# Patient Record
Sex: Female | Born: 1965 | Race: White | Hispanic: No | Marital: Married | Smoking: Former smoker
Health system: Southern US, Community
[De-identification: ages and names within clinical notes are randomized; demographics above are authoritative.]

## PROBLEM LIST (undated history)

## (undated) DIAGNOSIS — E119 Type 2 diabetes mellitus without complications: Secondary | ICD-10-CM

## (undated) DIAGNOSIS — G5603 Carpal tunnel syndrome, bilateral upper limbs: Secondary | ICD-10-CM

## (undated) DIAGNOSIS — Z7901 Long term (current) use of anticoagulants: Secondary | ICD-10-CM

## (undated) DIAGNOSIS — I6930 Unspecified sequelae of cerebral infarction: Secondary | ICD-10-CM

## (undated) DIAGNOSIS — B009 Herpesviral infection, unspecified: Secondary | ICD-10-CM

## (undated) DIAGNOSIS — E039 Hypothyroidism, unspecified: Secondary | ICD-10-CM

## (undated) DIAGNOSIS — F329 Major depressive disorder, single episode, unspecified: Secondary | ICD-10-CM

## (undated) DIAGNOSIS — I10 Essential (primary) hypertension: Secondary | ICD-10-CM

## (undated) DIAGNOSIS — F32A Depression, unspecified: Secondary | ICD-10-CM

## (undated) DIAGNOSIS — E041 Nontoxic single thyroid nodule: Secondary | ICD-10-CM

## (undated) HISTORY — DX: Major depressive disorder, single episode, unspecified: F32.9

## (undated) HISTORY — DX: Depression, unspecified: F32.A

---

## 2005-12-22 ENCOUNTER — Ambulatory Visit (HOSPITAL_COMMUNITY): Admission: RE | Admit: 2005-12-22 | Discharge: 2005-12-22 | Payer: Self-pay | Admitting: Orthopedic Surgery

## 2011-03-31 ENCOUNTER — Emergency Department (HOSPITAL_COMMUNITY)
Admission: EM | Admit: 2011-03-31 | Discharge: 2011-03-31 | Disposition: A | Discharge status: Home / Self Care | Payer: Self-pay | Attending: Emergency Medicine | Admitting: Emergency Medicine

## 2011-03-31 DIAGNOSIS — N760 Acute vaginitis: Secondary | ICD-10-CM | POA: Insufficient documentation

## 2011-03-31 DIAGNOSIS — N342 Other urethritis: Secondary | ICD-10-CM | POA: Insufficient documentation

## 2011-03-31 DIAGNOSIS — A499 Bacterial infection, unspecified: Secondary | ICD-10-CM | POA: Insufficient documentation

## 2011-03-31 DIAGNOSIS — B9689 Other specified bacterial agents as the cause of diseases classified elsewhere: Secondary | ICD-10-CM | POA: Insufficient documentation

## 2011-03-31 DIAGNOSIS — R21 Rash and other nonspecific skin eruption: Secondary | ICD-10-CM | POA: Insufficient documentation

## 2011-03-31 LAB — URINALYSIS, ROUTINE W REFLEX MICROSCOPIC
Glucose, UA: 500 mg/dL — AB
Ketones, ur: 15 mg/dL — AB
Nitrite: NEGATIVE
Protein, ur: 100 mg/dL — AB
Specific Gravity, Urine: >1.03 — ABNORMAL HIGH (ref 1.005–1.030)
Urobilinogen, UA: 0.2 mg/dL (ref 0.0–1.0)
pH: 5 (ref 5.0–8.0)

## 2011-03-31 LAB — URINE MICROSCOPIC-ADD ON

## 2011-03-31 LAB — WET PREP, GENITAL
Clue Cells Wet Prep HPF POC: NONE SEEN
Trich, Wet Prep: NONE SEEN
Yeast Wet Prep HPF POC: NONE SEEN

## 2011-03-31 LAB — PREGNANCY, URINE: Preg Test, Ur: NEGATIVE

## 2011-04-03 ENCOUNTER — Emergency Department (HOSPITAL_COMMUNITY)
Admission: EM | Admit: 2011-04-03 | Discharge: 2011-04-03 | Disposition: A | Discharge status: Home / Self Care | Payer: Self-pay | Attending: Emergency Medicine | Admitting: Emergency Medicine

## 2011-04-03 DIAGNOSIS — L538 Other specified erythematous conditions: Secondary | ICD-10-CM | POA: Insufficient documentation

## 2014-05-15 ENCOUNTER — Emergency Department (HOSPITAL_COMMUNITY)
Admission: EM | Admit: 2014-05-15 | Discharge: 2014-05-15 | Disposition: A | Discharge status: Home / Self Care | Payer: BC Managed Care – PPO | Attending: Emergency Medicine | Admitting: Emergency Medicine

## 2014-05-15 ENCOUNTER — Encounter (HOSPITAL_COMMUNITY): Payer: Self-pay | Admitting: Emergency Medicine

## 2014-05-15 DIAGNOSIS — R21 Rash and other nonspecific skin eruption: Secondary | ICD-10-CM | POA: Insufficient documentation

## 2014-05-15 DIAGNOSIS — A6000 Herpesviral infection of urogenital system, unspecified: Secondary | ICD-10-CM | POA: Insufficient documentation

## 2014-05-15 HISTORY — DX: Herpesviral infection, unspecified: B00.9

## 2014-05-15 LAB — URINE MICROSCOPIC-ADD ON

## 2014-05-15 LAB — URINALYSIS, ROUTINE W REFLEX MICROSCOPIC
Bilirubin Urine: NEGATIVE
Glucose, UA: >1000 mg/dL — AB
Ketones, ur: 15 mg/dL — AB
Leukocytes, UA: NEGATIVE
Nitrite: NEGATIVE
Specific Gravity, Urine: 1.02 (ref 1.005–1.030)
Urobilinogen, UA: 0.2 mg/dL (ref 0.0–1.0)
pH: 7 (ref 5.0–8.0)

## 2014-05-15 MED ORDER — VALACYCLOVIR HCL 500 MG PO TABS: 500.0000 mg | ORAL_TABLET | Freq: Two times a day (BID) | ORAL | Status: DC

## 2014-05-15 MED ORDER — OXYCODONE-ACETAMINOPHEN 5-325 MG PO TABS: 1.0000 | ORAL_TABLET | ORAL | Status: DC | PRN

## 2014-05-15 NOTE — ED Notes (Signed)
Pt c/o vaginal rash that started 4-5 days ago, was seen at urgent care yesterday, states "we are in between a few things". Pt states that she feels like the rash is herpes, states "I had a flare up years ago and it feels the same"

## 2014-05-15 NOTE — ED Provider Notes (Signed)
CSN: [id number]     Arrival date & time 05/15/14  1133 History  This chart was scribed for Natalie Razor, MD by Leone Payor, ED Scribe. This patient was seen in room APA01/[facility]01 and the patient's care was started 3:34 PM.    Chief Complaint  Patient presents with  . Rash    The history is provided by the patient. No language interpreter was used.    HPI Comments: [name] is a 48 y.o. female who presents to the Emergency Department complaining of 4 days of a gradual onset, gradually worsening, constant vulvar rash and vulvar pain. She describes symptoms as burning and is aggravated by urination. She reports a history of similar symptoms 15 years ago when she was diagnosed with herpes. She denies fever.   Past Medical History  Diagnosis Date  . Herpes    History reviewed. No pertinent past surgical history. No family history on file. History  Substance Use Topics  . Smoking status: Never Smoker   . Smokeless tobacco: Not on file  . Alcohol Use: No   OB History   Grav Para Term Preterm Abortions TAB SAB Ect Mult Living                 Review of Systems  Constitutional: Negative for fever.  Genitourinary:       +Vulvar rash +Vulvar pain  All other systems reviewed and are negative.     Allergies  Review of patient's allergies indicates no known allergies.  Home Medications   Prior to Admission medications   Not on File   BP 231/124  Pulse 109  Temp(Src) 99.4 F (37.4 C) (Oral)  Resp 18  Ht 5\' 5"  (1.651 m)  Wt 301 lb (136.533 kg)  BMI 50.09 kg/m2  SpO2 99% Physical Exam  Nursing note and vitals reviewed. Constitutional: She is oriented to person, place, and time. She appears well-developed and well-nourished.  HENT:  Head: Normocephalic.  Eyes: EOM are normal.  Neck: Normal range of motion.  Cardiovascular: Normal rate, regular rhythm and normal heart sounds.   Pulmonary/Chest: Effort normal and breath sounds normal.  Abdominal: She exhibits no  distension.  Genitourinary:  Erythematous rash to vulva and introitus with superficial shallow ulcers   Musculoskeletal: Normal range of motion.  Neurological: She is alert and oriented to person, place, and time.  Psychiatric: She has a normal mood and affect.    ED Course  Procedures (including critical care time)  DIAGNOSTIC STUDIES: Oxygen Saturation is 99% on RA, normal by my interpretation.    COORDINATION OF CARE: 3:37 PM Discussed treatment plan with pt at bedside and pt agreed to plan.   Labs Review Labs Reviewed  URINALYSIS, ROUTINE W REFLEX MICROSCOPIC - Abnormal; Notable for the following:    Glucose, UA >1000 (*)    Hgb urine dipstick TRACE (*)    Ketones, ur 15 (*)    Protein, ur TRACE (*)    All other components within normal limits  URINE MICROSCOPIC-ADD ON - Abnormal; Notable for the following:    Squamous Epithelial / LPF MANY (*)    Bacteria, UA FEW (*)    All other components within normal limits    Imaging Review No results found.   EKG Interpretation None      MDM   Final diagnoses:  Herpes genitalis    48 year old female with perineal/vulvar rash consistent with genital herpes. She has a history the same. Will place on Valtrex. She needs to followup  with PCP with regards to her hypertension.  No acute complaints which I can directly attribute to this and I do not feel that it needs emergent reduction or further evaluation at this time.  I personally preformed the services scribed in my presence. The recorded information has been reviewed is accurate. Natalie RazorStephen Stormy Connon, MD.   Natalie RazorStephen Rochell Mabie, MD 05/21/14 [phone]

## 2014-05-15 NOTE — Discharge Instructions (Signed)
Genital Herpes °Genital herpes is a sexually transmitted disease. This means that it is a disease passed by having sex with an infected person. There is no cure for genital herpes. The time between attacks can be months to years. The virus may live in a person but produce no problems (symptoms). This infection can be passed to a baby as it travels down the birth canal (vagina). In a newborn, this can cause central nervous system damage, eye damage, or even death. The virus that causes genital herpes is usually HSV-2 virus. The virus that causes oral herpes is usually HSV-1. The diagnosis (learning what is wrong) is made through culture results. °SYMPTOMS  °Usually symptoms of pain and itching begin a few days to a week after contact. It first appears as small blisters that progress to small painful ulcers which then scab over and heal after several days. It affects the outer genitalia, birth canal, cervix, penis, anal area, buttocks, and thighs. °HOME CARE INSTRUCTIONS  °· Keep ulcerated areas dry and clean. °· Take medications as directed. Antiviral medications can speed up healing. They will not prevent recurrences or cure this infection. These medications can also be taken for suppression if there are frequent recurrences. °· While the infection is active, it is contagious. Avoid all sexual contact during active infections. °· Condoms may help prevent spread of the herpes virus. °· Practice safe sex. °· Wash your hands thoroughly after touching the genital area. °· Avoid touching your eyes after touching your genital area. °· Inform your caregiver if you have had genital herpes and become pregnant. It is your responsibility to insure a safe outcome for your baby in this pregnancy. °· Only take over-the-counter or prescription medicines for pain, discomfort, or fever as directed by your caregiver. °SEEK MEDICAL CARE IF:  °· You have a recurrence of this infection. °· You do not respond to medications and are not  improving. °· You have new sources of pain or discharge which have changed from the original infection. °· You have an oral temperature above 102° F (38.9° C). °· You develop abdominal pain. °· You develop eye pain or signs of eye infection. °Document Released: 10/02/2000 Document Revised: 12/28/2011 Document Reviewed: 10/23/2009 °Exit[employer]® Patient Information ©2015 [employer]. This information is not intended to replace advice given to you by your health care provider. Make sure you discuss any questions you have with your health care provider. ° °

## 2014-05-15 NOTE — ED Notes (Signed)
MD at bedside. 

## 2016-05-22 ENCOUNTER — Observation Stay (HOSPITAL_COMMUNITY): Payer: 59

## 2016-05-22 ENCOUNTER — Observation Stay (HOSPITAL_COMMUNITY)
Admission: EM | Admit: 2016-05-22 | Discharge: 2016-05-23 | Disposition: A | Discharge status: Home / Self Care | Payer: 59 | Attending: Internal Medicine | Admitting: Internal Medicine

## 2016-05-22 ENCOUNTER — Encounter (HOSPITAL_COMMUNITY): Payer: Self-pay

## 2016-05-22 ENCOUNTER — Emergency Department (HOSPITAL_COMMUNITY): Payer: 59

## 2016-05-22 DIAGNOSIS — I639 Cerebral infarction, unspecified: Secondary | ICD-10-CM | POA: Diagnosis not present

## 2016-05-22 DIAGNOSIS — I160 Hypertensive urgency: Secondary | ICD-10-CM | POA: Diagnosis not present

## 2016-05-22 DIAGNOSIS — Z79899 Other long term (current) drug therapy: Secondary | ICD-10-CM | POA: Diagnosis not present

## 2016-05-22 DIAGNOSIS — I10 Essential (primary) hypertension: Secondary | ICD-10-CM | POA: Diagnosis not present

## 2016-05-22 DIAGNOSIS — R109 Unspecified abdominal pain: Secondary | ICD-10-CM | POA: Insufficient documentation

## 2016-05-22 DIAGNOSIS — E669 Obesity, unspecified: Secondary | ICD-10-CM | POA: Diagnosis present

## 2016-05-22 DIAGNOSIS — E041 Nontoxic single thyroid nodule: Secondary | ICD-10-CM | POA: Diagnosis present

## 2016-05-22 DIAGNOSIS — R200 Anesthesia of skin: Secondary | ICD-10-CM | POA: Diagnosis present

## 2016-05-22 LAB — COMPREHENSIVE METABOLIC PANEL
ALK PHOS: 84 U/L (ref 38–126)
ALT: 57 U/L — ABNORMAL HIGH (ref 14–54)
ANION GAP: 6 (ref 5–15)
AST: 35 U/L (ref 15–41)
Albumin: 4.4 g/dL (ref 3.5–5.0)
BUN: 12 mg/dL (ref 6–20)
CALCIUM: 8.9 mg/dL (ref 8.9–10.3)
CHLORIDE: 103 mmol/L (ref 101–111)
CO2: 24 mmol/L (ref 22–32)
Creatinine, Ser: 0.51 mg/dL (ref 0.44–1.00)
GFR calc non Af Amer: >60 mL/min (ref >60)
Glucose, Bld: 252 mg/dL — ABNORMAL HIGH (ref 65–99)
Potassium: 3.6 mmol/L (ref 3.5–5.1)
SODIUM: 133 mmol/L — AB (ref 135–145)
Total Bilirubin: 0.6 mg/dL (ref 0.3–1.2)
Total Protein: 8.3 g/dL — ABNORMAL HIGH (ref 6.5–8.1)

## 2016-05-22 LAB — ANTITHROMBIN III: AntiThromb III Func: 111 % (ref 75–120)

## 2016-05-22 LAB — URINALYSIS, ROUTINE W REFLEX MICROSCOPIC
BILIRUBIN URINE: NEGATIVE
Glucose, UA: 500 mg/dL — AB
Hgb urine dipstick: NEGATIVE
KETONES UR: 15 mg/dL — AB
LEUKOCYTES UA: NEGATIVE
NITRITE: NEGATIVE
Protein, ur: 30 mg/dL — AB
SPECIFIC GRAVITY, URINE: 1.02 (ref 1.005–1.030)
pH: 6 (ref 5.0–8.0)

## 2016-05-22 LAB — RAPID URINE DRUG SCREEN, HOSP PERFORMED
AMPHETAMINES: NOT DETECTED
BENZODIAZEPINES: NOT DETECTED
Barbiturates: NOT DETECTED
COCAINE: NOT DETECTED
Opiates: NOT DETECTED
TETRAHYDROCANNABINOL: NOT DETECTED

## 2016-05-22 LAB — CBC
HEMATOCRIT: 47 % — AB (ref 36.0–46.0)
HEMOGLOBIN: 16.4 g/dL — AB (ref 12.0–15.0)
MCH: 30.7 pg (ref 26.0–34.0)
MCHC: 34.9 g/dL (ref 30.0–36.0)
MCV: 88 fL (ref 78.0–100.0)
Platelets: 214 10*3/uL (ref 150–400)
RBC: 5.34 MIL/uL — ABNORMAL HIGH (ref 3.87–5.11)
RDW: 14 % (ref 11.5–15.5)
WBC: 9.2 10*3/uL (ref 4.0–10.5)

## 2016-05-22 LAB — DIFFERENTIAL
BASOS ABS: 0 10*3/uL (ref 0.0–0.1)
BASOS PCT: 0 %
EOS ABS: 0.2 10*3/uL (ref 0.0–0.7)
EOS PCT: 2 %
Lymphocytes Relative: 22 %
Lymphs Abs: 2.1 10*3/uL (ref 0.7–4.0)
MONO ABS: 0.9 10*3/uL (ref 0.1–1.0)
Monocytes Relative: 10 %
Neutro Abs: 6.1 10*3/uL (ref 1.7–7.7)
Neutrophils Relative %: 66 %

## 2016-05-22 LAB — TROPONIN I: Troponin I: <0.03 ng/mL (ref <0.03)

## 2016-05-22 LAB — PROTIME-INR
INR: 0.93
Prothrombin Time: 12.5 seconds (ref 11.4–15.2)

## 2016-05-22 LAB — URINE MICROSCOPIC-ADD ON
Bacteria, UA: NONE SEEN
RBC / HPF: NONE SEEN RBC/hpf (ref 0–5)
WBC UA: NONE SEEN WBC/hpf (ref 0–5)

## 2016-05-22 LAB — APTT: APTT: 27 s (ref 24–36)

## 2016-05-22 LAB — ETHANOL: Alcohol, Ethyl (B): <5 mg/dL (ref <5)

## 2016-05-22 MED ORDER — LORAZEPAM 2 MG/ML IJ SOLN
1.0000 mg | Freq: Once | INTRAMUSCULAR | Status: AC
  Administered 2016-05-22: 1 mg via INTRAVENOUS
  Filled 2016-05-22: qty 1

## 2016-05-22 MED ORDER — LABETALOL HCL 5 MG/ML IV SOLN
10.0000 mg | Freq: Once | INTRAVENOUS | Status: AC
  Administered 2016-05-22: 10 mg via INTRAVENOUS
  Filled 2016-05-22: qty 4

## 2016-05-22 MED ORDER — ASPIRIN 325 MG PO TABS
325.0000 mg | ORAL_TABLET | Freq: Every day | ORAL | Status: DC
  Administered 2016-05-22 – 2016-05-23 (×2): 325 mg via ORAL
  Filled 2016-05-22 (×2): qty 1

## 2016-05-22 MED ORDER — ENOXAPARIN SODIUM 40 MG/0.4ML ~~LOC~~ SOLN
40.0000 mg | SUBCUTANEOUS | Status: DC
  Administered 2016-05-22 – 2016-05-23 (×2): 40 mg via SUBCUTANEOUS
  Filled 2016-05-22 (×2): qty 0.4

## 2016-05-22 MED ORDER — CLOPIDOGREL BISULFATE 75 MG PO TABS
75.0000 mg | ORAL_TABLET | Freq: Every day | ORAL | Status: DC
  Administered 2016-05-22 – 2016-05-23 (×2): 75 mg via ORAL
  Filled 2016-05-22 (×2): qty 1

## 2016-05-22 MED ORDER — ATORVASTATIN CALCIUM 40 MG PO TABS
40.0000 mg | ORAL_TABLET | Freq: Every day | ORAL | Status: DC
  Administered 2016-05-22: 40 mg via ORAL
  Filled 2016-05-22: qty 1

## 2016-05-22 MED ORDER — VALACYCLOVIR HCL 500 MG PO TABS
500.0000 mg | ORAL_TABLET | Freq: Two times a day (BID) | ORAL | Status: DC
  Administered 2016-05-22 – 2016-05-23 (×3): 500 mg via ORAL
  Filled 2016-05-22 (×7): qty 1

## 2016-05-22 MED ORDER — SENNOSIDES-DOCUSATE SODIUM 8.6-50 MG PO TABS: 1.0000 | ORAL_TABLET | Freq: Every evening | ORAL | Status: DC | PRN

## 2016-05-22 MED ORDER — LABETALOL HCL 5 MG/ML IV SOLN
10.0000 mg | INTRAVENOUS | Status: DC | PRN
  Administered 2016-05-22 – 2016-05-23 (×2): 10 mg via INTRAVENOUS
  Filled 2016-05-22 (×2): qty 4

## 2016-05-22 MED ORDER — SODIUM CHLORIDE 0.9 % IV SOLN
INTRAVENOUS | Status: DC
  Administered 2016-05-22: 14:00:00 via INTRAVENOUS

## 2016-05-22 MED ORDER — STROKE: EARLY STAGES OF RECOVERY BOOK
Freq: Once | Status: AC
  Administered 2016-05-22: 11:00:00
  Filled 2016-05-22: qty 1

## 2016-05-22 NOTE — ED Notes (Signed)
Pt says numbness to her right side that started Tuesday off & on until numbness was always there since yesterday. Pt says blood pressure is always high but is not being treated at present.

## 2016-05-22 NOTE — ED Provider Notes (Addendum)
[facility]-EMER[facility]ENCY DEPT Provider Note   CSN: [id number] Arrival date & time: 05/22/16  4098  First Provider Contact:  First MD Initiated Contact with Patient 05/22/16 0542        History   Chief Complaint Chief Complaint  Patient presents with  . Numbness    HPI Natalie Snyder is a 50 y.o. female.  Patient presents to the emergency part for evaluation of right-sided numbness. Patient reports that symptoms began 2 days ago. For the first day the symptoms are intermittent, but yesterday she noticed that the numbness became persistent. Since yesterday she has had numbness of her right arm and right leg with slight right facial numbness. No speech disturbance. She has not noticed any weakness of the extremities. Patient found to be very hypertensive in triage. She does report that her blood pressure has always been high, but she is not currently treated for hypertension. There is no associated headache.      Past Medical History:  Diagnosis Date  . Herpes     There are no active problems to display for this patient.   History reviewed. No pertinent surgical history.  OB History    No data available       Home Medications    Prior to Admission medications   Medication Sig Start Date End Date Taking? Authorizing Provider  Multiple Vitamin (MULTIVITAMIN) capsule Take 1 capsule by mouth daily.   Yes Historical Provider, MD  oxyCODONE-acetaminophen (PERCOCET/ROXICET) 5-325 MG per tablet Take 1-2 tablets by mouth every 4 (four) hours as needed for severe pain. 05/15/14   Raeford Razor, MD  valACYclovir (VALTREX) 500 MG tablet Take 1 tablet (500 mg total) by mouth 2 (two) times daily. 05/15/14   Raeford Razor, MD    Family History No family history on file.  Social History Social History  Substance Use Topics  . Smoking status: Never Smoker  . Smokeless tobacco: Never Used  . Alcohol use No     Allergies   Review of patient's allergies indicates no known  allergies.   Review of Systems Review of Systems  Neurological: Positive for numbness (Right-sided extremities).  All other systems reviewed and are negative.    Physical Exam Updated Vital Signs BP (!) 178/109   Pulse 99   Temp 98.4 F (36.9 C) (Oral)   Resp 18   Ht  (1.651 m)   Wt 280 lb (127 kg)   SpO2 98%   BMI 46.59 kg/m   Physical Exam  Constitutional: She is oriented to person, place, and time. She appears well-developed and well-nourished. No distress.  HENT:  Head: Normocephalic and atraumatic.  Right Ear: Hearing normal.  Left Ear: Hearing normal.  Nose: Nose normal.  Mouth/Throat: Oropharynx is clear and moist and mucous membranes are normal.  Eyes: Conjunctivae and EOM are normal. Pupils are equal, round, and reactive to light.  Neck: Normal range of motion. Neck supple.  Cardiovascular: Regular rhythm, S1 normal and S2 normal.  Tachycardia present.  Exam reveals no gallop and no friction rub.   No murmur heard. Pulmonary/Chest: Effort normal and breath sounds normal. No respiratory distress. She exhibits no tenderness.  Abdominal: Soft. Normal appearance and bowel sounds are normal. There is no hepatosplenomegaly. There is no tenderness. There is no rebound, no guarding, no tenderness at McBurney's point and negative Murphy's sign. No hernia.  Musculoskeletal: Normal range of motion.  Neurological: She is alert and oriented to person, place, and time. She has normal strength. A sensory  deficit (Decreased sensation to light touch and pinprick right arm and right leg) is present. No cranial nerve deficit. Coordination normal. GCS eye subscore is 4. GCS verbal subscore is 5. GCS motor subscore is 6.  Extraocular muscle movement: normal No visual field cut Pupils: equal and reactive both direct and consensual response is normal No nystagmus present    Sensory function is decreased in right arm and leg to light touch, pinprick Proprioception intact  Grip  strength 5/5 symmetric in upper extremities No pronator drift Normal finger to nose bilaterally  Lower extremity strength 5/5 against gravity Normal heel to shin bilaterally     Skin: Skin is warm, dry and intact. No rash noted. No cyanosis.  Psychiatric: She has a normal mood and affect. Her speech is normal and behavior is normal. Thought content normal.  Nursing note and vitals reviewed.    ED Treatments / Results  Labs (all labs ordered are listed, but only abnormal results are displayed) Labs Reviewed  CBC - Abnormal; Notable for the following:       Result Value   RBC 5.34 (*)    Hemoglobin 16.4 (*)    HCT 47.0 (*)    All other components within normal limits  COMPREHENSIVE METABOLIC PANEL - Abnormal; Notable for the following:    Sodium 133 (*)    Glucose, Bld 252 (*)    Total Protein 8.3 (*)    ALT 57 (*)    All other components within normal limits  URINALYSIS, ROUTINE W REFLEX MICROSCOPIC (NOT AT [facility]) - Abnormal; Notable for the following:    Glucose, UA 500 (*)    Ketones, ur 15 (*)    Protein, ur 30 (*)    All other components within normal limits  URINE MICROSCOPIC-ADD ON - Abnormal; Notable for the following:    Squamous Epithelial / LPF 0-5 (*)    All other components within normal limits  ETHANOL  PROTIME-INR  APTT  DIFFERENTIAL  URINE RAPID DRUG SCREEN, HOSP PERFORMED  TROPONIN I    EKG  EKG Interpretation  Date/Time:  Friday May 22 2016 05:40:41 EDT Ventricular Rate:  108 PR Interval:    QRS Duration: 87 QT Interval:  337 QTC Calculation: 452 R Axis:   -20 Text Interpretation:  Sinus tachycardia Borderline left axis deviation Low voltage, precordial leads Anteroseptal infarct, old No previous tracing Confirmed by POLLINA  MD, CHRISTOPHER [phone]) on 05/22/2016 6:25:06 AM       Radiology Ct Head Wo Contrast  Result Date: 05/22/2016 CLINICAL DATA:  RIGHT extremity numbness for 3 days, weakness. History of hypertension. EXAM: CT HEAD  WITHOUT CONTRAST TECHNIQUE: Contiguous axial images were obtained from the base of the skull through the vertex without intravenous contrast. COMPARISON:  None. FINDINGS: INTRACRANIAL CONTENTS: The ventricles and sulci are normal. No intraparenchymal hemorrhage, mass effect nor midline shift. No acute large vascular territory infarcts. Patchy white matter changes, including LEFT periatrial white matter. Subcentimeter hypodensity LEFT lateral thalamus. Small RIGHT frontal convexity arachnoid cyst. No abnormal extra-axial fluid collections. Basal cisterns are patent. ORBITS: The included ocular globes and orbital contents are normal. SINUSES: The mastoid aircells and included paranasal sinuses are well-aerated. SKULL/SOFT TISSUES: No skull fracture. No significant soft tissue swelling. IMPRESSION: Age indeterminate LEFT lateral thalamic lacunar infarct, given RIGHT-sided weakness this could be acute. Mild white matter changes suggest chronic small vessel ischemic disease. Electronically Signed   By: Awilda Metro M.D.   On: 05/22/2016 06:35    Procedures Procedures (including  critical care time)  Medications Ordered in ED Medications  labetalol (NORMODYNE,TRANDATE) injection 10 mg (not administered)  labetalol (NORMODYNE,TRANDATE) injection 10 mg (10 mg Intravenous Given 05/22/16 0601)     Initial Impression / Assessment and Plan / ED Course  I have reviewed the triage vital signs and the nursing notes.  Pertinent labs & imaging results that were available during my care of the patient were reviewed by me and considered in my medical decision making (see chart for details).  Clinical Course   Patient presents to the emergency part for evaluation of right-sided numbness. Patient reports intermittent symptoms 2 days ago and then yesterday the numbness on the right side of her body became continuous. Last known normal was therefore more than 12 hours before presentation to the emergency department.  She was not a candidate for TPA. She does have decreased sensation to light touch and pinprick on her right upper and right lower extremity but there is no obvious weakness associated with the sensory symptoms. Patient was found to be markedly hypertensive on arrival, this was treated with labetalol. She does report a history of elevated blood pressures but has never been treated for hypertension. As blood pressure improved, sensory deficit has not resolved. Lab work was unremarkable. CT scan shows age indeterminate left thalamic stroke which might expand patient's current symptoms. Patient will require hospitalization for further stroke workup.  Final Clinical Impressions(s) / ED Diagnoses   Final diagnoses:  None  Hypertensive urgency CVA    New Prescriptions New Prescriptions   No medications on file     Gilda Crease, MD 05/22/16 [id number]    Gilda Crease, MD 05/22/16 8185    Gilda Crease, MD 05/22/16 [phone]

## 2016-05-22 NOTE — Progress Notes (Signed)
Patient off floor for procedure 

## 2016-05-22 NOTE — ED Notes (Signed)
Pt continues to have numbness to right extremity and right leg. Able to ambulate to BR independently . No difficulties with drinking water

## 2016-05-22 NOTE — Evaluation (Signed)
Speech Language Pathology Evaluation Patient Details Name: Natalie Snyder MRN: [id number] DOB: [birth year] Today's Date: 05/22/2016 Time: 8588-5027 SLP Time Calculation (min) (ACUTE ONLY): 14 min  Problem List:  Patient Active Problem List   Diagnosis Date Noted  . CVA (cerebral infarction) 05/22/2016  . HTN (hypertension) 05/22/2016  . Obesity (BMI 30-39.9) 05/22/2016  . Acute CVA (cerebrovascular accident) (HCC) 05/22/2016   Past Medical History:  Past Medical History:  Diagnosis Date  . Herpes    Past Surgical History: History reviewed. No pertinent surgical history. HPI:  Natalie Snyder is a 50 year old female  presents to the [facility] for evaluation of right-sided numbness. MRI of head confirms Acute infarction left lateral thalamus as well as old lacunar infarct in left basal ganglia.    Assessment / Plan / Recommendation Clinical Impression  Pts cognitive abilities appear within functional limits as noted following informal cognitive assessment. Pt alert and oriented x4 and denies acute cognitive changes since onset of CVA.   Speech and language abilities also appear within functional limits. No ST needs identified at this time.      SLP Assessment       Follow Up Recommendations  None    Frequency and Duration           SLP Evaluation Prior Functioning  Cognitive/Linguistic Baseline: Within functional limits  Lives With: Spouse Available Help at Discharge: Family;Available PRN/intermittently Education: 12th Vocation: Unemployed   Cognition  Overall Cognitive Status: Within Functional Limits for tasks assessed Arousal/Alertness: Awake/alert Orientation Level: Oriented X4    Comprehension  Auditory Comprehension Overall Auditory Comprehension: Appears within functional limits for tasks assessed Visual Recognition/Discrimination Discrimination: Within Function Limits Reading Comprehension Reading Status: Within funtional limits    Expression Expression Primary  Mode of Expression: Verbal Verbal Expression Overall Verbal Expression: Appears within functional limits for tasks assessed Written Expression Dominant Hand: Right Written Expression: Within Functional Limits   Oral / Motor  Oral Motor/Sensory Function Overall Oral Motor/Sensory Function: Within functional limits Motor Speech Overall Motor Speech: Appears within functional limits for tasks assessed   GO                   Marcene Duos MA, CCC-SLP Acute Care Speech Language Pathologist    Kennieth Rad 05/22/2016, 4:35 PM

## 2016-05-22 NOTE — Evaluation (Signed)
Physical Therapy Evaluation Patient Details Name: Natalie Snyder MRN: [id number] DOB: Dec 26, 1965 Today's Date: 05/22/2016   History of Present Illness  50 yo F admitted with R UE and R LE numbness that is intermittent and began 2 days ago.  Hypertensive urgency.  CT scan shows age indeterminate left thalamic stroke which might expand patient's current symptoms.  PMH: Herpes  Clinical Impression  Pt received sitting up in bed, and was agreeable to PT evaluation.  Pt's son, and husband also present.  Pt expressed that she is normally independent with ambulation, ADL's, and IADL's, and helps to care for her father.  During PT evaluation, pt was found to have mild ataxia in the R UE, and Moderate ataxia in the R LE.  Transfer and gait training not assessed due to HTN with pt's BP found to be 216/117 with HR at 100bpm while sitting on the EOB.  RN notified of this finding. Pt assisted back into bed.  RN reports that the pt has been getting herself up to the bathroom independently while in the hospital, therefore, she will likely need OPPT to address balance and incoordination issues with the R LE.     Follow Up Recommendations Outpatient PT    Equipment Recommendations  None recommended by PT    Recommendations for Other Services       Precautions / Restrictions Precautions Precautions: Fall Precaution Comments: Due to R sided incoordination.  Restrictions Weight Bearing Restrictions: No      Mobility  Bed Mobility Overal bed mobility: Modified Independent             General bed mobility comments: HOB completely raised, but pt able to sit on the EOB without assistance.    Transfers Overall transfer level:  (NT due to BP limitations)                  Ambulation/Gait Ambulation/Gait assistance:  (NT due to BP limitations. )              Stairs            Wheelchair Mobility    Modified Rankin (Stroke Patients Only)       Balance                                             Pertinent Vitals/Pain Pain Assessment: No/denies pain    Home Living Family/patient expects to be discharged to:: Private residence Living Arrangements: Spouse/significant other (son - Equities trader. )   Type of Home: Mobile home Home Access: Stairs to enter   Entrance Stairs-Number of Steps: 2 steps Home Layout: Two level (bedroom upstairs. ) Home Equipment: None      Prior Function Level of Independence: Independent         Comments: Pt looks after her dad - bills, preparing food.      Hand Dominance   Dominant Hand: Right    Extremity/Trunk Assessment   Upper Extremity Assessment: RUE deficits/detail RUE Deficits / Details: Strength is WFL, Mild ataxia with R finger to nose, rapid alternating movements is [facility]         Lower Extremity Assessment: RLE deficits/detail RLE Deficits / Details:  Strength is WFL, Incoordination noted with both R heel to shin test, and rapid alternating movements of the ankles        Communication   Communication: No difficulties  Cognition Arousal/Alertness: Awake/alert Behavior During Therapy: WFL for tasks assessed/performed Overall Cognitive Status: Within Functional Limits for tasks assessed                      General Comments      Exercises        Assessment/Plan    PT Assessment Patient needs continued PT services  PT Diagnosis Abnormality of gait   PT Problem List Decreased coordination;Decreased balance;Impaired sensation;Obesity;Decreased activity tolerance  PT Treatment Interventions Gait training;Balance training;Neuromuscular re-education;Patient/family education   PT Goals (Current goals can be found in the Care Plan section) Acute Rehab PT Goals Patient Stated Goal: To have better control of the R LE.  PT Goal Formulation: With patient Time For Goal Achievement: 05/29/16 Potential to Achieve Goals: Good    Frequency 7X/week   Barriers to discharge         Co-evaluation               End of Session   Activity Tolerance: Treatment limited secondary to medical complications (Comment) Patient left: in bed;with family/visitor present Nurse Communication: Other (comment) (BP)         Time: 1130-1146 PT Time Calculation (min) (ACUTE ONLY): 16 min   Charges:   PT Evaluation $PT Eval Low Complexity: 1 Procedure     PT G Codes:        Beth Tishara Pizano, PT, DPT X: [phone]

## 2016-05-22 NOTE — H&P (Signed)
[facility] History and Physical  Natalie Snyder:[id number] DOB: 02-Nov-1965    PCP:   No PCP Per Patient   Chief Complaint: right sided numbness but no weakness for 48 hours.   HPI: Natalie Snyder is an 50 y.o. 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  Evalaution in the ER with head CT showed hyperdense area concerning for intermediate CVA of the Left lateral thalamic CVA.  She has not been on ASA, and has not been on any medications, as she hardly goes to regular medical care.  Hospitalist was asked to admit her for acute CVA.  She was out of the TPA window.    Rewiew of Systems:  Constitutional: Negative for malaise, fever and chills. No significant weight loss or weight gain Eyes: Negative for eye pain, redness and discharge, diplopia, visual changes, or flashes of light. ENMT: Negative for ear pain, hoarseness, nasal congestion, sinus pressure and sore throat. No headaches; tinnitus, drooling, or problem swallowing. Cardiovascular: Negative for chest pain, palpitations, diaphoresis, dyspnea and peripheral edema. ; No orthopnea, PND Respiratory: Negative for cough, hemoptysis, wheezing and stridor. No pleuritic chestpain. Gastrointestinal: Negative for nausea, vomiting, diarrhea, constipation, abdominal pain, melena, blood in stool, hematemesis, jaundice and rectal bleeding.    Genitourinary: Negative for frequency, dysuria, incontinence,flank pain and hematuria; Musculoskeletal: Negative for back pain and neck pain. Negative for swelling and trauma.;  Skin: . Negative for pruritus, rash, abrasions, bruising and skin lesion.; ulcerations Neuro: Negative for headache, lightheadedness and neck stiffness. Negative for weakness, altered level of consciousness , altered mental status, extremity weakness, burning feet, involuntary movement, seizure and  syncope.  Psych: negative for anxiety, depression, insomnia, tearfulness, panic attacks, hallucinations, paranoia, suicidal or homicidal ideation    Past Medical History:  Diagnosis Date  . Herpes     History reviewed. No pertinent surgical history.  Medications:  HOME MEDS: Prior to Admission medications   Medication Sig Start Date End Date Taking? Authorizing Provider  Multiple Vitamin (MULTIVITAMIN) capsule Take 1 capsule by mouth daily.   Yes Historical Provider, MD  oxyCODONE-acetaminophen (PERCOCET/ROXICET) 5-325 MG per tablet Take 1-2 tablets by mouth every 4 (four) hours as needed for severe pain. 05/15/14   Raeford Razor, MD  valACYclovir (VALTREX) 500 MG tablet Take 1 tablet (500 mg total) by mouth 2 (two) times daily. 05/15/14   Raeford Razor, MD     Allergies:  No Known Allergies  Social History:   reports that she has never smoked. She has never used smokeless tobacco. She reports that she does not drink alcohol or use drugs.  Family History: History reviewed. No pertinent family history.   Physical Exam: Vitals:   05/22/16 0715 05/22/16 0730 05/22/16 0745 05/22/16 0800  BP: 161/88 (!) 190/112 (!) 174/111 (!) 169/103  Pulse: 96 102  100  Resp: 22 (!) 27 20   Temp:      TempSrc:      SpO2: 98% 99%  96%  Weight:      Height:       Blood pressure (!) 169/103, pulse 100, temperature 98.4 F (36.9 C), temperature source Oral, resp. rate 20, height 5\' 5"  (1.651 m), weight 127 kg (280 lb), SpO2 96 %.  GEN:  Pleasant  patient lying in the stretcher in no acute distress; cooperative with exam. PSYCH:  alert and oriented x4; does not appear anxious  or depressed; affect is appropriate. HEENT: Mucous membranes pink and anicteric; PERRLA; EOM intact; no cervical lymphadenopathy nor thyromegaly or carotid bruit; no JVD; There were no stridor. Neck is very supple. Breasts:: Not examined CHEST WALL: No tenderness CHEST: Normal respiration, clear to auscultation  bilaterally.  HEART: Regular rate and rhythm.  There are no murmur, rub, or gallops.   BACK: No kyphosis or scoliosis; no CVA tenderness ABDOMEN: soft and non-tender; no masses, no organomegaly, normal abdominal bowel sounds; no pannus; no intertriginous candida. There is no rebound and no distention. Rectal Exam: Not done EXTREMITIES: No bone or joint deformity; age-appropriate arthropathy of the hands and knees; no edema; no ulcerations.  There is no calf tenderness. Genitalia: not examined PULSES: 2+ and symmetric SKIN: Normal hydration no rash or ulceration CNS: Cranial nerves 2-12 grossly intact no focal lateralizing neurologic deficit.  Speech is fluent; uvula elevated with phonation, facial symmetry and tongue midline. DTR are normal bilaterally, cerebella exam is intact, barbinski is negative and strengths are equaled bilaterally.  No sensory loss.   Labs on Admission:  Basic Metabolic Panel:  Recent Labs Lab 05/22/16 0550  NA 133*  K 3.6  CL 103  CO2 24  GLUCOSE 252*  BUN 12  CREATININE 0.51  CALCIUM 8.9   Liver Function Tests:  Recent Labs Lab 05/22/16 0550  AST 35  ALT 57*  ALKPHOS 84  BILITOT 0.6  PROT 8.3*  ALBUMIN 4.4   CBC:  Recent Labs Lab 05/22/16 0550  WBC 9.2  NEUTROABS 6.1  HGB 16.4*  HCT 47.0*  MCV 88.0  PLT 214   Cardiac Enzymes:  Recent Labs Lab 05/22/16 0550  TROPONINI <0.03   Radiological Exams on Admission: Ct Head Wo Contrast  Result Date: 05/22/2016 CLINICAL DATA:  RIGHT extremity numbness for 3 days, weakness. History of hypertension. EXAM: CT HEAD WITHOUT CONTRAST TECHNIQUE: Contiguous axial images were obtained from the base of the skull through the vertex without intravenous contrast. COMPARISON:  None. FINDINGS: INTRACRANIAL CONTENTS: The ventricles and sulci are normal. No intraparenchymal hemorrhage, mass effect nor midline shift. No acute large vascular territory infarcts. Patchy white matter changes, including LEFT  periatrial white matter. Subcentimeter hypodensity LEFT lateral thalamus. Small RIGHT frontal convexity arachnoid cyst. No abnormal extra-axial fluid collections. Basal cisterns are patent. ORBITS: The included ocular globes and orbital contents are normal. SINUSES: The mastoid aircells and included paranasal sinuses are well-aerated. SKULL/SOFT TISSUES: No skull fracture. No significant soft tissue swelling. IMPRESSION: Age indeterminate LEFT lateral thalamic lacunar infarct, given RIGHT-sided weakness this could be acute. Mild white matter changes suggest chronic small vessel ischemic disease. Electronically Signed   By: Awilda Metro M.D.   On: 05/22/2016 06:35    EKG: Independently reviewed.  Assessment/Plan Present on Admission: . CVA (cerebral infarction) . HTN (hypertension) . Obesity (BMI 30-39.9) . Acute CVA (cerebrovascular accident) [facility])  PLAN:  Acute CVA:  I suspect she has an acute CVA.  Will admit her for MRI confirming an acute CVA.  She will be placed on ASA, and if MRI is positive, will place her on DUAT.  Will start her on statin, and check lipid profile.  WIll obtain ECHO, carotids US or MRA of the brain also.   She has severe HTN, and I suspect she has small vessel disease, but I will send hypercoagulable work up, given she is young.  HTN:  Will let permissive HTN.  Use Labetelol PRN for SBP higher than 190 or DBP greater than 105.  Obesity: Diet recommended.  She should also obtain polysomnogram given hx of loud snoring and HTN.    Other plans as per orders. Code Status: FULL Unk Lightning, MD. FACP [facility] Pager [phone] 7pm to 7am.  05/22/2016, 8:14 AM

## 2016-05-22 NOTE — Progress Notes (Signed)
Inpatient Diabetes Program Recommendations  AACE/ADA: New Consensus Statement on Inpatient Glycemic Control (2015)  Target Ranges:  Prepandial:   less than 140 mg/dL      Peak postprandial:   less than 180 mg/dL (1-2 hours)      Critically ill patients:  140 - 180 mg/dL   8/4 Lab Glucose:  791  Spoke with pt.  Pt denies hx of DM.HgbA1c ordered.  If DM is confirmed please order: Living Well with DM booklet, Nutrition consult, OP DM education and begin education videos on DM. Pt states that she does not have a PCP.   Will need to make sure there is someone with whom to follow up. Mellissa Kohut RD, CDE. M.Ed. Pager [phone] Inpatient Diabetes Coordinator

## 2016-05-22 NOTE — ED Triage Notes (Signed)
Pt reports intermittent numbness and tingling to her right side from neck down to her feet x 2 days, states is more consistent this am.  Pt denies pain.

## 2016-05-23 DIAGNOSIS — E669 Obesity, unspecified: Secondary | ICD-10-CM | POA: Diagnosis not present

## 2016-05-23 DIAGNOSIS — I639 Cerebral infarction, unspecified: Secondary | ICD-10-CM | POA: Diagnosis not present

## 2016-05-23 DIAGNOSIS — E041 Nontoxic single thyroid nodule: Secondary | ICD-10-CM | POA: Diagnosis present

## 2016-05-23 DIAGNOSIS — I10 Essential (primary) hypertension: Secondary | ICD-10-CM | POA: Diagnosis not present

## 2016-05-23 LAB — LIPID PANEL
CHOL/HDL RATIO: 6.1 ratio
CHOLESTEROL: 224 mg/dL — AB (ref 0–200)
HDL: 37 mg/dL — AB (ref >40)
LDL Cholesterol: 130 mg/dL — ABNORMAL HIGH (ref 0–99)
Triglycerides: 283 mg/dL — ABNORMAL HIGH (ref <150)
VLDL: 57 mg/dL — AB (ref 0–40)

## 2016-05-23 LAB — PROTEIN S, TOTAL: PROTEIN S AG TOTAL: 113 % (ref 60–150)

## 2016-05-23 LAB — PROTEIN C ACTIVITY: Protein C Activity: 196 % — ABNORMAL HIGH (ref 73–180)

## 2016-05-23 LAB — PROTEIN S ACTIVITY: PROTEIN S ACTIVITY: 112 % (ref 63–140)

## 2016-05-23 LAB — PROTEIN C, TOTAL: PROTEIN C, TOTAL: 132 % (ref 60–150)

## 2016-05-23 MED ORDER — CLOPIDOGREL BISULFATE 75 MG PO TABS: 75.0000 mg | ORAL_TABLET | Freq: Every day | ORAL | 2 refills | Status: AC

## 2016-05-23 MED ORDER — LISINOPRIL 20 MG PO TABS: 20.0000 mg | ORAL_TABLET | Freq: Every day | ORAL | 1 refills | Status: DC

## 2016-05-23 MED ORDER — ATORVASTATIN CALCIUM 40 MG PO TABS: 40.0000 mg | ORAL_TABLET | Freq: Every day | ORAL | 1 refills | Status: AC

## 2016-05-23 MED ORDER — LISINOPRIL 10 MG PO TABS: 20.0000 mg | ORAL_TABLET | Freq: Every day | ORAL | Status: DC

## 2016-05-23 NOTE — Progress Notes (Signed)
Patient discharged home with instructions,given on medications,and follow up visits,patient verbalized understanding. Prescriptions sent with patient. Stroke booklet also given to patient,mapping your way to recovery. Accompanied by staff to an awaiting vehicle.

## 2016-05-23 NOTE — Discharge Summary (Signed)
Physician Discharge Summary  Natalie Snyder BJY:[id number] DOB: Apr 07, 1966 DOA: 05/22/2016  PCP: No PCP Per Patient  Admit date: 05/22/2016 Discharge date: 05/23/2016  Time spent: 35 minutes  Recommendations for Outpatient Follow-up:  1. Referred to Dr Gerda Diss for follow up new PCP.   2. Follow up with Dr Gerilyn Pilgrim of neurology for future stroke prevention.    Discharge Diagnoses:  Principal Problem:   CVA (cerebral infarction) Active Problems:   HTN (hypertension)   Obesity (BMI 30-39.9)   Acute CVA (cerebrovascular accident) [facility])   Discharge Condition: Same.   Diet recommendation: heart healthy.   Filed Weights   05/22/16 0536 05/22/16 0940  Weight: 127 kg (280 lb) 131.7 kg (290 lb 4.8 oz)    History of present illness: Patient was admitted by me on May 22, 2016 for acute CVA.  As per my prior H and P:  " Natalie Snyder is an 50 y.o. 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  Evalaution in the ER with head CT showed hyperdense area concerning for intermediate CVA of the Left lateral thalamic CVA.  She has not been on ASA, and has not been on any medications, as she hardly goes to regular medical care.  Hospitalist was asked to admit her for acute CVA.  She was out of the TPA window.   Hospital Course: Patient was admitted into the hospital, and her severe HTN was controlled with IV Labetelol, allowing reasonable permissive HTN.  She did have a CVA confirmed by the MRI in the left thalamic.  There are non critical obstruction of the cerebral arteries.  See description below.   Her ECHO at the time of discharge was still pending.  Her carotid did not show any significant stenosis, but incidentally picked up a thyroid nodule.  She was told and acknowledge recommendation for follow up thyroid ultrasound.  For her CVA, BP should be better controlled, and she was  started on Lisinopril  per day at discharge.  She was warned about angioedema, which will require immediate medical attention, along with idiopathic coughs.  She will also be placed on DUAT with ASA along with Plavix.  She was started on Lipitor as her lipid profiled showed low HDL, elevated total and LDL cholesterol.  She should also take fish oil, 2-4 grams per day.  She will get polysomnogram as I have high clinical suspicion that she has OSA.  It was noted that she has depression with anxiety features vs GAD, and though I have not started her on any medication, suggested that Lexapro ( Cis-isomer of Citalopram) would be a good choice of SSRI for her.  She doesn't have any PCP, and I have recommended a few PCPs, including Dr Gerda Diss.  She should follow up with neurology for follow up on her CVA.  Thank you and Good Day.    Consultations:  None.   Discharge Exam: Vitals:   05/23/16 0440 05/23/16 0843  BP: (!) 152/89 (!) 166/89  Pulse: 85 94  Resp: 20   Temp: 98.6 F (37 C) 98.1 F (36.7 C)      Discharge Instructions   Discharge Instructions    Diet - low sodium heart healthy    Complete by:  As directed   Discharge instructions    Complete by:  As directed   Follow up with Dr Gerda Diss or Dr Karilyn Cota.  Follow up with neurology Dr Jerre Simon as scheduled.   Increase activity slowly    Complete by:  As directed     Current Discharge Medication List    START taking these medications   Details  atorvastatin (LIPITOR) 40 MG tablet Take 1 tablet (40 mg total) by mouth daily at 6 PM. Qty: 30 tablet, Refills: 1    clopidogrel (PLAVIX) 75 MG tablet Take 1 tablet (75 mg total) by mouth daily. Qty: 30 tablet, Refills: 2    Linsinopril 20mg  per day.   CONTINUE these medications which have NOT CHANGED   Details  aspirin 325 MG tablet Take 650 mg by mouth once.    Cholecalciferol (VITAMIN D PO) Take 1 tablet by mouth daily.    Omega-3 Fatty Acids (FISH OIL PO) Take 3 capsules by mouth  daily.      STOP taking these medications     Cyanocobalamin (VITAMIN B-12 PO)      Multiple Vitamin (MULTIVITAMIN) capsule      naproxen sodium (ANAPROX) 220 MG tablet      TURMERIC PO      valACYclovir (VALTREX) 500 MG tablet        Allergies  Allergen Reactions  . Latex Rash      The results of significant diagnostics from this hospitalization (including imaging, microbiology, ancillary and laboratory) are listed below for reference.    Significant Diagnostic Studies: Ct Head Wo Contrast  Result Date: 05/22/2016 CLINICAL DATA:  RIGHT extremity numbness for 3 days, weakness. History of hypertension. EXAM: CT HEAD WITHOUT CONTRAST TECHNIQUE: Contiguous axial images were obtained from the base of the skull through the vertex without intravenous contrast. COMPARISON:  None. FINDINGS: INTRACRANIAL CONTENTS: The ventricles and sulci are normal. No intraparenchymal hemorrhage, mass effect nor midline shift. No acute large vascular territory infarcts. Patchy white matter changes, including LEFT periatrial white matter. Subcentimeter hypodensity LEFT lateral thalamus. Small RIGHT frontal convexity arachnoid cyst. No abnormal extra-axial fluid collections. Basal cisterns are patent. ORBITS: The included ocular globes and orbital contents are normal. SINUSES: The mastoid aircells and included paranasal sinuses are well-aerated. SKULL/SOFT TISSUES: No skull fracture. No significant soft tissue swelling. IMPRESSION: Age indeterminate LEFT lateral thalamic lacunar infarct, given RIGHT-sided weakness this could be acute. Mild white matter changes suggest chronic small vessel ischemic disease. Electronically Signed   By: Awilda Metro M.D.   On: 05/22/2016 06:35   Mr Maxine Glenn Head Wo Contrast  Result Date: 05/22/2016 CLINICAL DATA:  Numbness of the right side beginning 3 days ago. EXAM: MRI HEAD WITHOUT CONTRAST MRA HEAD WITHOUT CONTRAST TECHNIQUE: Multiplanar, multiecho pulse sequences of the brain  and surrounding structures were obtained without intravenous contrast. Angiographic images of the head were obtained using MRA technique without contrast. COMPARISON:  CT same day FINDINGS: MRI HEAD FINDINGS Diffusion imaging shows acute infarction affecting the lateral left thalamus. No other acute infarction. The brainstem and cerebellum are normal. Cerebral hemispheres otherwise show an old infarction in the white matter adjacent to the atrium of the left lateral ventricle in an old lacunar infarction in the left basal ganglia. Mild chronic small-vessel ischemic changes seen elsewhere within the hemispheric white matter. There is a prominent CSF space at the vertex on the right favored to represent an arachnoid cyst rather than an old cortical infarction. No evidence of mass lesion, hemorrhage, hydrocephalus or extra-axial collection. No pituitary mass. No inflammatory sinus disease. No skull or skullbase lesion. MRA HEAD FINDINGS Both internal carotid arteries are widely patent into the  brain. The anterior and middle cerebral vessels are patent proximally. There is 50% stenosis of the left M2 segment. There is narrowing and irregularity in M2 branches bilaterally. Distal anterior cerebral branches show irregularity and narrowing. Both vertebral arteries are widely patent to the basilar. No basilar stenosis. Narrowing and irregularity noted in the more distal branch vessels affecting the right posterior inferior cerebellar artery, left anterior inferior cerebellar artery and both posterior cerebral arteries. IMPRESSION: Acute infarction left lateral thalamus. Old lacunar infarction left hemispheric deep white matter adjacent to the atrium of the left lateral ventricle and left basal ganglia. No large vessel occlusion or correctable proximal intracranial stenosis. Considerable atherosclerotic narrowing and irregularity of the medium size intracranial branches diffusely. Electronically Signed   By: Paulina Fusi  M.D.   On: 05/22/2016 14:02   Mr Brain Wo Contrast  Result Date: 05/22/2016 CLINICAL DATA:  Numbness of the right side beginning 3 days ago. EXAM: MRI HEAD WITHOUT CONTRAST MRA HEAD WITHOUT CONTRAST TECHNIQUE: Multiplanar, multiecho pulse sequences of the brain and surrounding structures were obtained without intravenous contrast. Angiographic images of the head were obtained using MRA technique without contrast. COMPARISON:  CT same day FINDINGS: MRI HEAD FINDINGS Diffusion imaging shows acute infarction affecting the lateral left thalamus. No other acute infarction. The brainstem and cerebellum are normal. Cerebral hemispheres otherwise show an old infarction in the white matter adjacent to the atrium of the left lateral ventricle in an old lacunar infarction in the left basal ganglia. Mild chronic small-vessel ischemic changes seen elsewhere within the hemispheric white matter. There is a prominent CSF space at the vertex on the right favored to represent an arachnoid cyst rather than an old cortical infarction. No evidence of mass lesion, hemorrhage, hydrocephalus or extra-axial collection. No pituitary mass. No inflammatory sinus disease. No skull or skullbase lesion. MRA HEAD FINDINGS Both internal carotid arteries are widely patent into the brain. The anterior and middle cerebral vessels are patent proximally. There is 50% stenosis of the left M2 segment. There is narrowing and irregularity in M2 branches bilaterally. Distal anterior cerebral branches show irregularity and narrowing. Both vertebral arteries are widely patent to the basilar. No basilar stenosis. Narrowing and irregularity noted in the more distal branch vessels affecting the right posterior inferior cerebellar artery, left anterior inferior cerebellar artery and both posterior cerebral arteries. IMPRESSION: Acute infarction left lateral thalamus. Old lacunar infarction left hemispheric deep white matter adjacent to the atrium of the left  lateral ventricle and left basal ganglia. No large vessel occlusion or correctable proximal intracranial stenosis. Considerable atherosclerotic narrowing and irregularity of the medium size intracranial branches diffusely. Electronically Signed   By: Paulina Fusi M.D.   On: 05/22/2016 14:02   US Carotid Bilateral (at [facility] And [facility] Only)  Result Date: 05/22/2016 CLINICAL DATA:  50 year old female with a history of numbness. Cardiovascular risk factors include hypertension, known stroke/ TIA. EXAM: BILATERAL CAROTID DUPLEX ULTRASOUND TECHNIQUE: Wallace Cullens scale imaging, color Doppler and duplex ultrasound were performed of bilateral carotid and vertebral arteries in the neck. COMPARISON:  No prior duplex FINDINGS: Criteria: Quantification of carotid stenosis is based on velocity parameters that correlate the residual internal carotid diameter with NASCET-based stenosis levels, using the diameter of the distal internal carotid lumen as the denominator for stenosis measurement. The following velocity measurements were obtained: RIGHT ICA:  Systolic 61 cm/sec, Diastolic 20 cm/sec CCA:  70 set cm/sec SYSTOLIC ICA/CCA RATIO:  0.8 ECA:  108 cm/sec LEFT ICA:  Systolic 75 cm/sec, Diastolic 27  cm/sec CCA:  102 cm/sec SYSTOLIC ICA/CCA RATIO:  0.7 ECA:  127 cm/sec Right Brachial SBP: Not acquired Left Brachial SBP: Not acquired RIGHT CAROTID ARTERY: No significant calcified disease of the right common carotid artery. Intermediate waveform maintained. Heterogeneous plaque without significant calcifications at the right carotid bifurcation. Low resistance waveform of the right ICA. No significant tortuosity. RIGHT VERTEBRAL ARTERY: Antegrade flow with low resistance waveform. LEFT CAROTID ARTERY: No significant calcified disease of the left common carotid artery. Intermediate waveform maintained. Heterogeneous plaque at the left carotid bifurcation without significant calcifications. Low resistance waveform of the left ICA. LEFT  VERTEBRAL ARTERY:  Antegrade flow with low resistance waveform. Additional: Right thyroid nodule. IMPRESSION: Color duplex indicates minimal heterogeneous plaque, with no hemodynamically significant stenosis by duplex criteria in the extracranial cerebrovascular circulation. Incidental finding of a right thyroid nodule. Diagnostic ultrasound thyroid recommended for further evaluation. Signed, Yvone Neu. Loreta Ave, DO [facility] Electronically Signed   By: Gilmer Mor D.O.   On: 05/22/2016 17:11    Microbiology: No results found for this or any previous visit (from the past 240 hour(s)).   Labs: Basic Metabolic Panel:  Recent Labs Lab 05/22/16 0550  NA 133*  K 3.6  CL 103  CO2 24  GLUCOSE 252*  BUN 12  CREATININE 0.51  CALCIUM 8.9   Liver Function Tests:  Recent Labs Lab 05/22/16 0550  AST 35  ALT 57*  ALKPHOS 84  BILITOT 0.6  PROT 8.3*  ALBUMIN 4.4   CBC:  Recent Labs Lab 05/22/16 0550  WBC 9.2  NEUTROABS 6.1  HGB 16.4*  HCT 47.0*  MCV 88.0  PLT 214   Cardiac Enzymes:  Recent Labs Lab 05/22/16 0550  TROPONINI <0.03    Signed:  Houston Siren MD. Jerrel Ivory.  [facility] 05/23/2016, 1:25 PM

## 2016-05-23 NOTE — Progress Notes (Signed)
PT Cancellation Note  Patient Details Name: Natalie Snyder MRN: [id number] DOB: [birth year]   Cancelled Treatment:     Attempted to see pt today. Independent with bed mobility to sitting EOB and transition sit to stand. At 10:45am BP was 188/122 mmHg sitting and increased to 203/117 mmHg upon standing. Pt was returned to her bed and session was held today. RN notified and in the room giving BP medications at end of session.    11:53 AM,05/23/16 Marylyn Ishihara PT, DPT [facility] [phone]

## 2016-05-25 LAB — BETA-2-GLYCOPROTEIN I ABS, IGG/M/A
Beta-2 Glyco I IgG: <9 GPI IgG units (ref 0–20)
Beta-2-Glycoprotein I IgM: <9 GPI IgM units (ref 0–32)

## 2016-05-25 LAB — HEMOGLOBIN A1C
HEMOGLOBIN A1C: 9.9 % — AB (ref 4.8–5.6)
MEAN PLASMA GLUCOSE: 237 mg/dL

## 2016-05-25 LAB — HOMOCYSTEINE: Homocysteine: 6.3 umol/L (ref 0.0–15.0)

## 2016-05-25 LAB — CARDIOLIPIN ANTIBODIES, IGG, IGM, IGA
Anticardiolipin IgG: <9 GPL U/mL (ref 0–14)
Anticardiolipin IgM: <9 MPL U/mL (ref 0–12)

## 2016-05-25 LAB — LUPUS ANTICOAGULANT PANEL
DRVVT: 41.1 s (ref 0.0–47.0)
PTT Lupus Anticoagulant: 38.2 s (ref 0.0–51.9)

## 2016-05-28 LAB — PROTHROMBIN GENE MUTATION

## 2016-05-28 LAB — FACTOR 5 LEIDEN

## 2016-06-16 ENCOUNTER — Other Ambulatory Visit (HOSPITAL_COMMUNITY): Payer: Self-pay | Admitting: Family Medicine

## 2016-06-16 DIAGNOSIS — E041 Nontoxic single thyroid nodule: Secondary | ICD-10-CM

## 2016-06-19 ENCOUNTER — Ambulatory Visit (HOSPITAL_COMMUNITY)
Admission: RE | Admit: 2016-06-19 | Discharge: 2016-06-19 | Disposition: A | Discharge status: Home / Self Care | Payer: 59 | Source: Ambulatory Visit | Attending: Family Medicine | Admitting: Family Medicine

## 2016-06-19 DIAGNOSIS — E041 Nontoxic single thyroid nodule: Secondary | ICD-10-CM

## 2016-06-24 ENCOUNTER — Other Ambulatory Visit (HOSPITAL_COMMUNITY): Payer: Self-pay | Admitting: Family Medicine

## 2016-06-24 DIAGNOSIS — E041 Nontoxic single thyroid nodule: Secondary | ICD-10-CM

## 2016-07-01 ENCOUNTER — Encounter (HOSPITAL_COMMUNITY): Payer: Self-pay

## 2016-07-01 ENCOUNTER — Ambulatory Visit (HOSPITAL_COMMUNITY)
Admission: RE | Admit: 2016-07-01 | Discharge: 2016-07-01 | Disposition: A | Discharge status: Home / Self Care | Payer: 59 | Source: Ambulatory Visit | Attending: Family Medicine | Admitting: Family Medicine

## 2016-07-01 DIAGNOSIS — E041 Nontoxic single thyroid nodule: Secondary | ICD-10-CM

## 2016-07-01 MED ORDER — LIDOCAINE HCL (PF) 2 % IJ SOLN
INTRAMUSCULAR | Status: AC
  Filled 2016-07-01: qty 10

## 2016-07-01 NOTE — Discharge Instructions (Signed)

## 2016-07-07 ENCOUNTER — Ambulatory Visit (HOSPITAL_COMMUNITY)
Admission: RE | Admit: 2016-07-07 | Discharge: 2016-07-07 | Disposition: A | Discharge status: Home / Self Care | Payer: 59 | Source: Ambulatory Visit | Attending: Family Medicine | Admitting: Family Medicine

## 2016-07-07 DIAGNOSIS — E041 Nontoxic single thyroid nodule: Secondary | ICD-10-CM | POA: Insufficient documentation

## 2016-07-07 MED ORDER — LIDOCAINE HCL (PF) 2 % IJ SOLN
INTRAMUSCULAR | Status: AC
  Administered 2016-07-07: 10 mL
  Filled 2016-07-07: qty 10

## 2016-07-07 NOTE — Procedures (Signed)
PreOperative Dx: RT thyroid nodule Postoperative Dx: RT thyroid nodule Procedure:   US guided FNA of RT thyroid nodule Radiologist:  Densil Ottey Anesthesia:  2.5 ml of 2% lidocaine Specimen:  FNA x 3  EBL:   < 1 ml Complications: None 

## 2016-07-07 NOTE — Discharge Instructions (Signed)

## 2016-07-20 ENCOUNTER — Ambulatory Visit (HOSPITAL_COMMUNITY): Payer: 59 | Attending: Family Medicine | Admitting: Physical Therapy

## 2016-07-20 DIAGNOSIS — M6281 Muscle weakness (generalized): Secondary | ICD-10-CM | POA: Insufficient documentation

## 2016-07-20 DIAGNOSIS — R2681 Unsteadiness on feet: Secondary | ICD-10-CM | POA: Diagnosis present

## 2016-07-20 NOTE — Patient Instructions (Addendum)
Ankle Pump    With right  leg elevated, gently flex and extend ankle. Move through full range of motion. Repeat _20___ times per set. Do _1___ sets per session. Do 2 ____ sessions per day.  http://orth.exer.us/32   Copyright  VHI. All rights reserved.  Strengthening: Straight Leg Raise (Phase 1)    Tighten muscles on front of right thigh, then lift leg __15__ inches from surface, keeping knee locked.  Repeat __15__ times per set. Do _1___ sets per session. Do __2__ sessions per day.  http://orth.exer.us/614   Copyright  VHI. All rights reserved.  Strengthening: Hip Abduction (Side-Lying)    Tighten muscles on front of right  thigh, then lift leg _15___ inches from surface, keeping knee locked.  Repeat _15___ times per set. Do _1___ sets per session. Do __1__ sessions per day.  http://orth.exer.us/622   Copyright  VHI. All rights reserved.  Functional Quadriceps: Sit to Stand    Sit on edge of chair, feet flat on floor. Stand upright, extending knees fully. Repeat _5___ times per set. Do ___1_ sets per session. Do ___1_ sessions per day. As fast as possible  http://orth.exer.us/734   Copyright  VHI. All rights reserved.

## 2016-07-20 NOTE — Therapy (Signed)
[facility] [address] [place], [place], [address] Phone: [phone]   Fax:  [phone]  Physical Therapy Evaluation  Patient Details  Name: Natalie Snyder MRN: [id number] Date of Birth: Feb 24, 1966 Referring Provider: Donzetta Sprung   Encounter Date: 07/20/2016      PT End of Session - 07/20/16 1523    Visit Number 1   Number of Visits 8   Date for PT Re-Evaluation 08/19/16   Authorization Type UHC   Authorization - Visit Number 1   Authorization - Number of Visits 8   PT Start Time 1437   PT Stop Time 1518   PT Time Calculation (min) 41 min   Activity Tolerance Patient tolerated treatment well      Past Medical History:  Diagnosis Date  . Herpes     No past surgical history on file.  There were no vitals filed for this visit.       Subjective Assessment - 07/20/16 1502    Subjective Natalie Snyder is a 50 yo female who had a Lt CVA on 05/22/2016.  She was discharged on 05/23/2016.  She is walking with no assistive device.  She is cleaning, washing dishes, and doing everything that she normally does it just takes a little more time.     Pertinent History HTN under control now; under control     How long can you sit comfortably? no problem    How long can you stand comfortably? able to stand for 30 minutes    How long can you walk comfortably? does not know.    Patient Stated Goals To feel stonger and not to give out as easily    Currently in Pain? No/denies            [facility] PT Assessment - 07/20/16 0001      Assessment   Medical Diagnosis Lt CVA   Referring Provider Donzetta Sprung    Onset Date/Surgical Date 05/22/16   Hand Dominance Right   Next MD Visit 07/27/2016   Prior Therapy none     Precautions   Precautions None     Restrictions   Weight Bearing Restrictions No     Balance Screen   Has the patient fallen in the past 6 months No   Has the patient had a decrease in activity level because of a fear of  falling?  Yes   Is the patient reluctant to leave their home because of a fear of falling?  No     Home [employer]ist information centre manager residence   Home Access Stairs to enter   Entrance Stairs-Number of Steps 3   Entrance Stairs-Rails Right   Home Layout Two level;Bed/bath upstairs   Alternate Level Stairs-Number of Steps 10  able to go up reciprocal      Prior Function   Level of Independence Independent   Vocation Unemployed   Leisure none     Cognition   Overall Cognitive Status Within Functional Limits for tasks assessed     Functional Tests   Functional tests Sit to Stand     Sit to Stand   Comments 5 in 12:54 ;      ROM / Strength   AROM / PROM / Strength Strength     Strength   Strength Assessment Site Hip;Knee;Ankle   Right/Left Hip Right;Left   Right Hip Flexion 4+/5   Right Hip Extension 5/5   Right Hip ABduction 4-/5   Left  Hip Flexion 5/5   Left Hip Extension 5/5   Left Hip ABduction 5/5   Right/Left Knee Right;Left   Right Knee Flexion 5/5   Right Knee Extension 5/5   Left Knee Flexion 5/5   Left Knee Extension 5/5   Right/Left Ankle Right;Left   Right Ankle Dorsiflexion 4/5   Right Ankle Plantar Flexion 4/5  do to past right knee injury pt is fearful    Left Ankle Dorsiflexion 5/5   Left Ankle Plantar Flexion 5/5     6 Minute Walk- Baseline   6 Minute Walk- Baseline --  6' = 658'      Standardized Balance Assessment   Standardized Balance Assessment --  previous injury =pt will not SLS; Tandem stance 30seconds B                            PT Education - 07/20/16 1522    Education provided Yes   Education Details the importance of walking for health; HEP including a walking program    Person(s) Educated Patient   Methods Explanation   Comprehension Verbalized understanding          PT Short Term Goals - 07/20/16 1538      PT SHORT TERM GOAL #1   Title Pt mm strength to be at least 4+/5 to allow pt to  ambulate for 6 minutes without haveing to rest to be more functional with housecleaning activites.    Time 2   Period Weeks     PT SHORT TERM GOAL #2   Title Pt to be able to go up and down 2 flights of steps without difficulty    Time 2   Period Weeks   Status New           PT Long Term Goals - 07/20/16 1545      PT LONG TERM GOAL #1   Title Pt to be able to walk for 15 minutes without resting to allow pt to complete short shopping trips.    Time 4   Period Weeks   Status New     PT LONG TERM GOAL #2   Title Pt to increase velocity of ambulation to be able to walk 1200 feet in 6 minutes to be able to complete her normal ADL's in less time with less fatigut.    Time 4   Period Weeks   Status New     PT LONG TERM GOAL #3   Title PT to be able to tandem stance for 30 seconds on non-compliant surface to increase confidence walking in her yard.    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  She has not recieved any physical therapy and has noticed that she has decreased activity tolerance and strength.  She is able to do her normal activities it just takes a significant increased time to get them done therefore she was referred to skilled physical therapy.  Examination demonstrates decreased activity tolerance, decreased balance, decreased strength.  Natalie Snyder will benefit from skilled PT to improve these issues and maximize her functional ability.  She has had a past Rt knee injury therefore balance will be done without any single leg activity as this makes the patient nervous.  Rehab Potential Good   PT Frequency 2x / week   PT Duration 4 weeks   PT Treatment/Interventions ADLs/Self Care Home Management;Stair training;Functional mobility training;Patient/family education;Therapeutic activities;Therapeutic exercise;Balance training;Neuromuscular  re-education   PT Next Visit Plan begin tandem stance on foam, heel toe and retro on foam; functional squat, side step with green t-band, lunging progessing to warrior stances    Consulted and Agree with Plan of Care Patient      Patient will benefit from skilled therapeutic intervention in order to improve the following deficits and impairments:  Cardiopulmonary status limiting activity, Decreased activity tolerance, Decreased balance, Decreased strength, Obesity  Visit Diagnosis: Muscle weakness (generalized) - Plan: PT plan of care cert/re-cert  Unsteadiness on feet - Plan: PT plan of care cert/re-cert     Problem List Patient Active Problem List   Diagnosis Date Noted  . Thyroid nodule 05/23/2016  . CVA (cerebral infarction) 05/22/2016  . HTN (hypertension) 05/22/2016  . Obesity (BMI 30-39.9) 05/22/2016  . Acute CVA (cerebrovascular accident) [facility](HCC) 05/22/2016   Virgina Organynthia Russell, PT CLT [phone] 07/20/2016, 3:56 PM  [facility] [address] [place], [place], [address] Phone: [phone]   Fax:  [phone]  Name: Natalie Snyder MRN: [id number] Date of Birth: January 12, 1966

## 2016-07-22 ENCOUNTER — Ambulatory Visit (HOSPITAL_COMMUNITY): Payer: 59 | Admitting: Physical Therapy

## 2016-07-22 DIAGNOSIS — R2681 Unsteadiness on feet: Secondary | ICD-10-CM

## 2016-07-22 DIAGNOSIS — M6281 Muscle weakness (generalized): Secondary | ICD-10-CM | POA: Diagnosis not present

## 2016-07-22 NOTE — Therapy (Signed)
[facility] [address] [place], [place], [address] Phone: [phone]   Fax:  [phone]  Physical Therapy Treatment  Patient Details  Name: Natalie Snyder MRN: [id number] Date of Birth: 1966-07-08 Referring Provider: Donzetta Sprung   Encounter Date: 07/22/2016      PT End of Session - 07/22/16 1556    Visit Number 2   Number of Visits 8   Date for PT Re-Evaluation 08/19/16   Authorization Type UHC   Authorization - Visit Number 2   Authorization - Number of Visits 8   PT Start Time 1434   PT Stop Time 1512   PT Time Calculation (min) 38 min   Activity Tolerance Patient tolerated treatment well   Behavior During Therapy [facility] for tasks assessed/performed      Past Medical History:  Diagnosis Date  . Herpes     No past surgical history on file.  There were no vitals filed for this visit.      Subjective Assessment - 07/22/16 1435    Subjective Patietn arrives feeling well today, states she has been doing well with HEP, which she is able to demonstate well with minimial verbal cues. She does state that her SOB and flushing is new since the most recent stroke    Pertinent History HTN under control now; under control     Patient Stated Goals To feel stonger and not to give out as easily    Currently in Pain? No/denies                         [facility] Adult PT Treatment/Exercise - 07/22/16 0001      Knee/Hip Exercises: Standing   Other Standing Knee Exercises eccentric sit to stand 1x10      Knee/Hip Exercises: Supine   Bridges Both;2 sets;10 reps     Knee/Hip Exercises: Sidelying   Hip ABduction Both;2 sets;10 reps     Knee/Hip Exercises: Prone   Straight Leg Raises Both;2 sets;10 reps     413ft of speed walking, severe SOB/DOE noted        Balance Exercises - 07/22/16 1443      Balance Exercises: Standing   Standing Eyes Closed Narrow base of support (BOS);Foam/compliant surface;3 reps   Tandem Stance Eyes open;Foam/compliant surface;3 reps   Rockerboard Lateral;EO;Other (comment)  2 minutes    Tandem Gait 4 reps;Forward;Other (comment)  84ft            PT Education - 07/22/16 1555    Education provided Yes   Education Details reviewed HEP and initial eval/goals; relation of HR, HR max and relation to functional performance/functional activity tolerance, recommended she get a personal HR monitor and speak to MD about tachycardia    Person(s) Educated Patient   Methods Explanation;Handout   Comprehension Verbalized understanding          PT Short Term Goals - 07/20/16 1538      PT SHORT TERM GOAL #1   Title Pt mm strength to be at least 4+/5 to allow pt to ambulate for 6 minutes without haveing to rest to be more functional with housecleaning activites.    Time 2   Period Weeks     PT SHORT TERM GOAL #2   Title Pt to be able to go up and down 2 flights of steps without difficulty    Time 2   Period Weeks   Status New  PT Long Term Goals - 07/20/16 1545      PT LONG TERM GOAL #1   Title Pt to be able to walk for 15 minutes without resting to allow pt to complete short shopping trips.    Time 4   Period Weeks   Status New     PT LONG TERM GOAL #2   Title Pt to increase velocity of ambulation to be able to walk 1200 feet in 6 minutes to be able to complete her normal ADL's in less time with less fatigut.    Time 4   Period Weeks   Status New     PT LONG TERM GOAL #3   Title PT to be able to tandem stance for 30 seconds on non-compliant surface to increase confidence walking in her yard.    Time 4   Period Weeks               Plan - 07/22/16 1557    Clinical Impression Statement Focused session on trio of functional strength, balance, and functional activity tolerance today; noted ongoing SOB/DOE with exertion as well as significant fatigue after ambulating only approximately 46340ft with extended walking today. Rest breaks  provided PRN, exercises adjusted as needed due to patient's concern regarding prior knee injury. Noted difficulty with balance tasks today initially as well however performance on each task did perform throughout session with practice of task. Did note tachycardia today, and performed close monitoring throughout session; HR did respond and lower appropriately to seated rest breaks- education provided regarding importance of regular HR monitoring during PT and throughout her day in general. Highest HR noted today was 140BPM however O2 appears to remain WNL.    Rehab Potential Good   PT Frequency 2x / week   PT Duration 4 weeks   PT Treatment/Interventions ADLs/Self Care Home Management;Stair training;Functional mobility training;Patient/family education;Therapeutic activities;Therapeutic exercise;Balance training;Neuromuscular re-education   PT Next Visit Plan functional strength, balance (avoid SLS due to pre-existing knee injury), functional activity tolerance; MONITOR AND ADJUST FOR HR as she gets quite tachycardic with activity    Consulted and Agree with Plan of Care Patient      Patient will benefit from skilled therapeutic intervention in order to improve the following deficits and impairments:  Cardiopulmonary status limiting activity, Decreased activity tolerance, Decreased balance, Decreased strength, Obesity  Visit Diagnosis: Muscle weakness (generalized)  Unsteadiness on feet     Problem List Patient Active Problem List   Diagnosis Date Noted  . Thyroid nodule 05/23/2016  . CVA (cerebral infarction) 05/22/2016  . HTN (hypertension) 05/22/2016  . Obesity (BMI 30-39.9) 05/22/2016  . Acute CVA (cerebrovascular accident) (HCC) 05/22/2016    Nedra HaiKristen Latonyia Lopata PT, DPT [phone]  [facility] [address] [place], [place], [address] Phone: [phone]   Fax:  [phone]  Name: Natalie Mannanngel D Snyder MRN: [id number] Date of Birth:  [birth year]

## 2016-07-28 ENCOUNTER — Ambulatory Visit (HOSPITAL_COMMUNITY): Payer: 59 | Admitting: Physical Therapy

## 2016-07-30 ENCOUNTER — Ambulatory Visit (HOSPITAL_COMMUNITY): Payer: 59 | Admitting: Physical Therapy

## 2016-07-30 DIAGNOSIS — M6281 Muscle weakness (generalized): Secondary | ICD-10-CM | POA: Diagnosis not present

## 2016-07-30 DIAGNOSIS — R2681 Unsteadiness on feet: Secondary | ICD-10-CM

## 2016-07-30 NOTE — Therapy (Signed)
[facility] [address] [place], [place], [address] Phone: [phone]   Fax:  [phone]  Physical Therapy Treatment  Patient Details  Name: Natalie Snyder MRN: [id number] Date of Birth: 01-17-1966 Referring Provider: Donzetta Sprung   Encounter Date: 07/30/2016      PT End of Session - 07/30/16 1543    Visit Number 3   Number of Visits 8   Date for PT Re-Evaluation 08/19/16   Authorization Type UHC   Authorization - Visit Number 3   Authorization - Number of Visits 8   PT Start Time 1518   PT Stop Time 1600   PT Time Calculation (min) 42 min   Activity Tolerance Patient tolerated treatment well   Behavior During Therapy [facility] for tasks assessed/performed      Past Medical History:  Diagnosis Date  . Herpes     No past surgical history on file.  There were no vitals filed for this visit.      Subjective Assessment - 07/30/16 1523    Subjective Pt states she went back to her medical MD on Tuesday and he added a med for her HR.  States she doesn't remember the name and didn't bring it with her. Currently without pain.   Currently in Pain? No/denies                         [facility] Adult PT Treatment/Exercise - 07/30/16 0001      Knee/Hip Exercises: Standing   Heel Raises 15 reps   Heel Raises Limitations toerasies 15 reps   Hip Abduction Both;10 reps   Hip Extension Both;10 reps   Rocker Board 2 minutes   Rocker Board Limitations A/P and Rt/Lt             Balance Exercises - 07/30/16 1527      Balance Exercises: Standing   Standing Eyes Closed Narrow base of support (BOS);Foam/compliant surface;2 reps;30 secs   Tandem Stance Eyes open;Foam/compliant surface;3 reps   Tandem Gait 1 rep  30 second hold without UE assist             PT Short Term Goals - 07/20/16 1538      PT SHORT TERM GOAL #1   Title Pt mm strength to be at least 4+/5 to allow pt to ambulate for 6 minutes without  haveing to rest to be more functional with housecleaning activites.    Time 2   Period Weeks     PT SHORT TERM GOAL #2   Title Pt to be able to go up and down 2 flights of steps without difficulty    Time 2   Period Weeks   Status New           PT Long Term Goals - 07/20/16 1545      PT LONG TERM GOAL #1   Title Pt to be able to walk for 15 minutes without resting to allow pt to complete short shopping trips.    Time 4   Period Weeks   Status New     PT LONG TERM GOAL #2   Title Pt to increase velocity of ambulation to be able to walk 1200 feet in 6 minutes to be able to complete her normal ADL's in less time with less fatigut.    Time 4   Period Weeks   Status New     PT LONG TERM GOAL #3   Title  PT to be able to tandem stance for 30 seconds on non-compliant surface to increase confidence walking in her yard.    Time 4   Period Weeks               Plan - 07/30/16 1543    Clinical Impression Statement Primary focus on balance this session.  Assessed heart rate throughout session remaining between 90-120 bpm.  Pt without symptoms of distress at anytime during treatment.  Began dynamic balance actvities with patient requiring 1 rest between each with total of 4 during session today.  Pt diaphoretic at times but no other symtoms.  Better stability with Lt LE leading while completing tandem stance.    Rehab Potential Good   PT Frequency 2x / week   PT Duration 4 weeks   PT Treatment/Interventions ADLs/Self Care Home Management;Stair training;Functional mobility training;Patient/family education;Therapeutic activities;Therapeutic exercise;Balance training;Neuromuscular re-education   PT Next Visit Plan functional strength, balance (avoid SLS due to pre-existing knee injury), functional activity tolerance; MONITOR AND ADJUST FOR HR as she gets quite tachycardic with activity    Consulted and Agree with Plan of Care Patient      Patient will benefit from skilled  therapeutic intervention in order to improve the following deficits and impairments:  Cardiopulmonary status limiting activity, Decreased activity tolerance, Decreased balance, Decreased strength, Obesity  Visit Diagnosis: Muscle weakness (generalized)  Unsteadiness on feet     Problem List Patient Active Problem List   Diagnosis Date Noted  . Thyroid nodule 05/23/2016  . CVA (cerebral infarction) 05/22/2016  . HTN (hypertension) 05/22/2016  . Obesity (BMI 30-39.9) 05/22/2016  . Acute CVA (cerebrovascular accident) [facility](HCC) 05/22/2016    Lurena Nidamy B Karissa Meenan, PTA/CLT [phone]  07/30/2016, 3:59 PM  [facility] [address] [place], [place], [address] Phone: [phone]   Fax:  [phone]  Name: Natalie Snyder MRN: [id number] Date of Birth: 11-Oct-1966

## 2016-07-30 NOTE — Addendum Note (Signed)
Encounter addended by: Zenovia JordanSteven W Almadelia Looman, RN on: 07/30/2016 11:51 AM<BR>    Actions taken: Charge Capture section accepted

## 2016-08-04 ENCOUNTER — Ambulatory Visit (HOSPITAL_COMMUNITY): Payer: 59

## 2016-08-04 DIAGNOSIS — M6281 Muscle weakness (generalized): Secondary | ICD-10-CM | POA: Diagnosis not present

## 2016-08-04 DIAGNOSIS — R2681 Unsteadiness on feet: Secondary | ICD-10-CM

## 2016-08-04 NOTE — Therapy (Signed)
[facility] [address] [place], [place], [address] Phone: [phone]   Fax:  [phone]  Physical Therapy Treatment  Patient Details  Name: Natalie Snyder MRN: [id number] Date of Birth: [birth year] Referring Provider: Donzetta Sprungerry Daniel   Encounter Date: 08/04/2016      PT End of Session - 08/04/16 1610    Visit Number 4   Number of Visits 8   Date for PT Re-Evaluation 08/19/16   Authorization Type UHC   Authorization - Visit Number 4   Authorization - Number of Visits 8   PT Start Time 1606   PT Stop Time 1644   PT Time Calculation (min) 38 min   Equipment Utilized During Treatment Gait belt   Activity Tolerance Patient tolerated treatment well;Patient limited by fatigue   Behavior [facility] Therapy [facility] for tasks assessed/performed      Past Medical History:  Diagnosis Date  . Herpes     No past surgical history on file.  There were no vitals filed for this visit.      Subjective Assessment - 08/04/16 1605    Subjective Pt stated she is feeling good today, no reports of current pain.  Stated biggest issue currently with balance, no reports of falls.     Pertinent History HTN under control now; under control     Patient Stated Goals To feel stonger and not to give out as easily    Currently in Pain? No/denies            [facility] Adult [facility] Treatment/Exercise - 08/04/16 0001      Knee/Hip Exercises: Standing   Heel Raises 15 reps   Heel Raises Limitations toerasies 15 reps   Rocker Board 2 minutes   Rocker Board Limitations A/P (no HHA) and Rt/Lt (intermittent HHA)     Knee/Hip Exercises: Seated   Sit to Sand 10 reps;without UE support             Balance Exercises - 08/04/16 1633      Balance Exercises: Standing   Standing Eyes Closed Narrow base of support (BOS);Foam/compliant surface;2 reps;30 secs   Tandem Stance Eyes open;Foam/compliant surface;3 reps   Rockerboard Anterior/posterior;Lateral  2 minutes    Tandem Gait 2 reps   Sidestepping Theraband;2 reps  RTB             PT Short Term Goals - 07/20/16 1538      PT SHORT TERM GOAL #1   Title Pt mm strength to be at least 4+/5 to allow pt to ambulate for 6 minutes without haveing to rest to be more functional with housecleaning activites.    Time 2   Period Weeks     PT SHORT TERM GOAL #2   Title Pt to be able to go up and down 2 flights of steps without difficulty    Time 2   Period Weeks   Status New           PT Long Term Goals - 07/20/16 1545      PT LONG TERM GOAL #1   Title Pt to be able to walk for 15 minutes without resting to allow pt to complete short shopping trips.    Time 4   Period Weeks   Status New     PT LONG TERM GOAL #2   Title Pt to increase velocity of ambulation to be able to walk 1200 feet in 6 minutes to be able to complete her normal ADL's  in less time with less fatigut.    Time 4   Period Weeks   Status New     PT LONG TERM GOAL #3   Title PT to be able to tandem stance for 30 seconds on non-compliant surface to increase confidence walking in her yard.    Time 4   Period Weeks               Plan - 08/04/16 1640    Clinical Impression Statement Continued primary focus on balance training as well as LE strengthening.  HR was assessed throughout session with range 89-118 bpm.  Pt without symptoms of distress at anytime during session.  Added balance activities with min A required for safety.  Pt was limited by fatigue with actiivty with seated rest breaks required through session.  No reports of pain.     Rehab Potential Good   PT Frequency 2x / week   PT Duration 4 weeks   PT Treatment/Interventions ADLs/Self Care Home Management;Stair training;Functional mobility training;Patient/family education;Therapeutic activities;Therapeutic exercise;Balance training;Neuromuscular re-education   PT Next Visit Plan functional strength, balance (avoid SLS due to pre-existing knee injury),  functional activity tolerance; MONITOR AND ADJUST FOR HR as she gets quite tachycardic with activity       Patient will benefit from skilled therapeutic intervention in order to improve the following deficits and impairments:  Cardiopulmonary status limiting activity, Decreased activity tolerance, Decreased balance, Decreased strength, Obesity  Visit Diagnosis: Muscle weakness (generalized)  Unsteadiness on feet     Problem List Patient Active Problem List   Diagnosis Date Noted  . Thyroid nodule 05/23/2016  . CVA (cerebral infarction) 05/22/2016  . HTN (hypertension) 05/22/2016  . Obesity (BMI 30-39.9) 05/22/2016  . Acute CVA (cerebrovascular accident) [facility]) 05/22/2016   Becky Sax, LPTA; CBIS [phone]  Juel Burrow 08/04/2016, 6:54 PM  [facility] [address] [place], [place], [address] Phone: [phone]   Fax:  [phone]  Name: Natalie Snyder MRN: [id number] Date of Birth: June 08, 1966

## 2016-08-06 ENCOUNTER — Telehealth (HOSPITAL_COMMUNITY): Payer: Self-pay

## 2016-08-06 ENCOUNTER — Ambulatory Visit (HOSPITAL_COMMUNITY): Payer: 59

## 2016-08-06 NOTE — Telephone Encounter (Signed)
08/06/16 pt left a message to cx but no reason was given

## 2016-08-11 ENCOUNTER — Ambulatory Visit (HOSPITAL_COMMUNITY): Payer: 59 | Admitting: Physical Therapy

## 2016-08-11 DIAGNOSIS — M6281 Muscle weakness (generalized): Secondary | ICD-10-CM | POA: Diagnosis not present

## 2016-08-11 DIAGNOSIS — R2681 Unsteadiness on feet: Secondary | ICD-10-CM

## 2016-08-11 NOTE — Therapy (Signed)
[facility] [address] [place], [place], [address] Phone: [phone]   Fax:  [phone]  Physical Therapy Treatment  Patient Details  Name: Natalie Snyder MRN: [id number] Date of Birth: August 26, 1966 Referring Provider: Donzetta Sprung   Encounter Date: 08/11/2016      PT End of Session - 08/11/16 1604    Visit Number 5   Number of Visits 8   Date for PT Re-Evaluation 08/19/16   Authorization Type [employer]   Authorization - Visit Number 5   Authorization - Number of Visits 8   PT Start Time 1520   PT Stop Time 1600   PT Time Calculation (min) 40 min   Equipment Utilized During Treatment Gait belt   Activity Tolerance Patient tolerated treatment well;Patient limited by fatigue   Behavior During Therapy [facility] for tasks assessed/performed      Past Medical History:  Diagnosis Date  . Herpes     No past surgical history on file.  There were no vitals filed for this visit.      Subjective Assessment - 08/11/16 1521    Subjective Pt is doing her exercises she has a treadmill but has not gotten it in the house yet   Pertinent History HTN under control now; under control     Patient Stated Goals To feel stonger and not to give out as easily    Currently in Pain? No/denies             Balance Exercises - 08/11/16 1535      Balance Exercises: Standing   Standing Eyes Opened Narrow base of support (BOS);Head turns;Foam/compliant surface;5 reps   Tandem Stance Eyes open;Foam/compliant surface;5 reps  with head turns.    SLS with Vectors Solid surface;3 reps;10 secs   Balance Beam retro x 2 RT    Tandem Gait Forward;Foam/compliant surface;2 reps  over hurdles side step over hurdles as well 2 RT    Sit to Stand Time 10             PT Short Term Goals - 08/11/16 1609      PT SHORT TERM GOAL #1   Title Pt mm strength to be at least 4+/5 to allow pt to ambulate for 6 minutes without haveing to rest to be more functional  with housecleaning activites.    Time 2   Period Weeks   Status On-going     PT SHORT TERM GOAL #2   Title Pt to be able to go up and down 2 flights of steps without difficulty    Time 2   Period Weeks   Status On-going           PT Long Term Goals - 08/11/16 1610      PT LONG TERM GOAL #1   Title Pt to be able to walk for 15 minutes without resting to allow pt to complete short shopping trips.    Time 4   Period Weeks   Status On-going     PT LONG TERM GOAL #2   Title Pt to increase velocity of ambulation to be able to walk 1200 feet in 6 minutes to be able to complete her normal ADL's in less time with less fatigut.    Time 4   Period Weeks   Status On-going     PT LONG TERM GOAL #3   Title PT to be able to tandem stance for 30 seconds on non-compliant surface to increase confidence walking  in her yard.    Time 4   Period Weeks   Status On-going               Plan - 08/11/16 1605    Clinical Impression Statement Added hurdles to balance beam to increase difficulty with balance.  Pt needed multiple rest breaks throughout treatment as well as min-guard; assist to ensure safety throughout treatment.  Began vector stances with pt at bar to hold if needed and having pt complete short vectors.  Pt was uneasy with this task due to previous knee injury but able to successfully complete.  Educated pt that she is on her one knee when walking and it will improve strength.    Rehab Potential Good   PT Frequency 2x / week   PT Duration 4 weeks   PT Treatment/Interventions ADLs/Self Care Home Management;Stair training;Functional mobility training;Patient/family education;Therapeutic activities;Therapeutic exercise;Balance training;Neuromuscular re-education   PT Next Visit Plan functional strength, balance (avoid SLS due to pre-existing knee injury), functional activity tolerance; MONITOR AND ADJUST FOR HR as she gets quite tachycardic with activity       Patient will  benefit from skilled therapeutic intervention in order to improve the following deficits and impairments:  Cardiopulmonary status limiting activity, Decreased activity tolerance, Decreased balance, Decreased strength, Obesity  Visit Diagnosis: Muscle weakness (generalized)  Unsteadiness on feet     Problem List Patient Active Problem List   Diagnosis Date Noted  . Thyroid nodule 05/23/2016  . CVA (cerebral infarction) 05/22/2016  . HTN (hypertension) 05/22/2016  . Obesity (BMI 30-39.9) 05/22/2016  . Acute CVA (cerebrovascular accident) [facility](HCC) 05/22/2016  Virgina Organynthia Veona Bittman, PT CLT [phone] 08/11/2016, 4:12 PM  [facility] [address] [place], [place], [address] Phone: [phone]   Fax:  [phone]  Name: Natalie Snyder MRN: [id number] Date of Birth: 12/09/1965

## 2016-08-13 ENCOUNTER — Ambulatory Visit (HOSPITAL_COMMUNITY): Payer: 59 | Admitting: Physical Therapy

## 2016-08-19 ENCOUNTER — Telehealth (HOSPITAL_COMMUNITY): Payer: Self-pay | Admitting: Family Medicine

## 2016-08-19 ENCOUNTER — Ambulatory Visit (HOSPITAL_COMMUNITY): Payer: 59 | Admitting: Physical Therapy

## 2016-08-19 NOTE — Telephone Encounter (Signed)
08/19/16 she cx because of her blood pressure being high

## 2016-08-26 NOTE — Addendum Note (Signed)
Encounter addended by: Zenovia JordanSteven W Antwaine Boomhower, RN on: 08/26/2016 11:53 AM<BR>    Actions taken: Charge Capture section accepted

## 2016-08-26 NOTE — Addendum Note (Signed)
Encounter addended by: Rochel Bromeichard F Rosa Wyly on: 08/26/2016 11:47 AM<BR>    Actions taken: Charge Capture section accepted

## 2016-09-14 ENCOUNTER — Emergency Department (HOSPITAL_COMMUNITY)
Admission: EM | Admit: 2016-09-14 | Discharge: 2016-09-14 | Disposition: A | Discharge status: Home / Self Care | Payer: 59 | Attending: Emergency Medicine | Admitting: Emergency Medicine

## 2016-09-14 ENCOUNTER — Encounter (HOSPITAL_COMMUNITY): Payer: Self-pay | Admitting: Emergency Medicine

## 2016-09-14 DIAGNOSIS — Z91040 Latex allergy status: Secondary | ICD-10-CM | POA: Diagnosis not present

## 2016-09-14 DIAGNOSIS — R55 Syncope and collapse: Secondary | ICD-10-CM | POA: Diagnosis present

## 2016-09-14 DIAGNOSIS — Z79899 Other long term (current) drug therapy: Secondary | ICD-10-CM | POA: Insufficient documentation

## 2016-09-14 DIAGNOSIS — E860 Dehydration: Secondary | ICD-10-CM | POA: Insufficient documentation

## 2016-09-14 DIAGNOSIS — Z7984 Long term (current) use of oral hypoglycemic drugs: Secondary | ICD-10-CM | POA: Diagnosis not present

## 2016-09-14 DIAGNOSIS — E119 Type 2 diabetes mellitus without complications: Secondary | ICD-10-CM | POA: Diagnosis not present

## 2016-09-14 DIAGNOSIS — I10 Essential (primary) hypertension: Secondary | ICD-10-CM | POA: Insufficient documentation

## 2016-09-14 LAB — PREGNANCY, URINE: PREG TEST UR: NEGATIVE

## 2016-09-14 LAB — CBC WITH DIFFERENTIAL/PLATELET
BASOS ABS: 0 10*3/uL (ref 0.0–0.1)
Basophils Relative: 0 %
EOS PCT: 3 %
Eosinophils Absolute: 0.4 10*3/uL (ref 0.0–0.7)
HEMATOCRIT: 41.3 % (ref 36.0–46.0)
Hemoglobin: 14.8 g/dL (ref 12.0–15.0)
LYMPHS ABS: 2.1 10*3/uL (ref 0.7–4.0)
LYMPHS PCT: 18 %
MCH: 31.3 pg (ref 26.0–34.0)
MCHC: 35.8 g/dL (ref 30.0–36.0)
MCV: 87.3 fL (ref 78.0–100.0)
MONO ABS: 1 10*3/uL (ref 0.1–1.0)
MONOS PCT: 8 %
NEUTROS ABS: 8.1 10*3/uL — AB (ref 1.7–7.7)
Neutrophils Relative %: 71 %
PLATELETS: 259 10*3/uL (ref 150–400)
RBC: 4.73 MIL/uL (ref 3.87–5.11)
RDW: 13.6 % (ref 11.5–15.5)
WBC: 11.5 10*3/uL — ABNORMAL HIGH (ref 4.0–10.5)

## 2016-09-14 LAB — URINALYSIS, ROUTINE W REFLEX MICROSCOPIC
BILIRUBIN URINE: NEGATIVE
GLUCOSE, UA: 500 mg/dL — AB
HGB URINE DIPSTICK: NEGATIVE
Leukocytes, UA: NEGATIVE
Nitrite: NEGATIVE
Protein, ur: NEGATIVE mg/dL
SPECIFIC GRAVITY, URINE: 1.02 (ref 1.005–1.030)
pH: 6 (ref 5.0–8.0)

## 2016-09-14 LAB — BASIC METABOLIC PANEL
Anion gap: 11 (ref 5–15)
BUN: 12 mg/dL (ref 6–20)
CHLORIDE: 101 mmol/L (ref 101–111)
CO2: 22 mmol/L (ref 22–32)
CREATININE: 0.64 mg/dL (ref 0.44–1.00)
Calcium: 9.3 mg/dL (ref 8.9–10.3)
GFR calc Af Amer: >60 mL/min (ref >60)
GFR calc non Af Amer: >60 mL/min (ref >60)
GLUCOSE: 241 mg/dL — AB (ref 65–99)
Potassium: 3.5 mmol/L (ref 3.5–5.1)
SODIUM: 134 mmol/L — AB (ref 135–145)

## 2016-09-14 LAB — CBG MONITORING, ED: Glucose-Capillary: 237 mg/dL — ABNORMAL HIGH (ref 65–99)

## 2016-09-14 MED ORDER — SODIUM CHLORIDE 0.9 % IV BOLUS (SEPSIS)
1000.0000 mL | Freq: Once | INTRAVENOUS | Status: AC
  Administered 2016-09-14: 1000 mL via INTRAVENOUS

## 2016-09-14 NOTE — ED Notes (Signed)
Patient on cardiac monitoring at this time. Patient has had EKG shown to EDP

## 2016-09-14 NOTE — Discharge Instructions (Signed)
Drink plenty of fluids and rest for 24 hours  Please obtain all of your results from medical records or have your doctors office obtain the results - share them with your doctor - you should be seen at your doctors office in the next 2 days. Call today to arrange your follow up. Take the medications as prescribed. Please review all of the medicines and only take them if you do not have an allergy to them. Please be aware that if you are taking birth control pills, taking other prescriptions, ESPECIALLY ANTIBIOTICS may make the birth control ineffective - if this is the case, either do not engage in sexual activity or use alternative methods of birth control such as condoms until you have finished the medicine and your family doctor says it is OK to restart them. If you are on a blood thinner such as COUMADIN, be aware that any other medicine that you take may cause the coumadin to either work too much, or not enough - you should have your coumadin level rechecked in next 7 days if this is the case.  ?  It is also a possibility that you have an allergic reaction to any of the medicines that you have been prescribed - Everybody reacts differently to medications and while MOST people have no trouble with most medicines, you may have a reaction such as nausea, vomiting, rash, swelling, shortness of breath. If this is the case, please stop taking the medicine immediately and contact your physician.  ?  You should return to the ER if you develop severe or worsening symptoms.

## 2016-09-14 NOTE — ED Provider Notes (Signed)
[facility]-EMERGENCY DEPT Provider Note   CSN: [id number] Arrival date & time: 09/14/16  1933 By signing my name below, I, Linus GalasMaharshi Patel, attest that this documentation has been prepared under the direction and in the presence of Eber HongBrian Derran Sear, MD. Electronically Signed: Linus GalasMaharshi Patel, ED Scribe. 09/14/16. 8:02 PM.  History   Chief Complaint Chief Complaint  Patient presents with  . Dizziness   The history is provided by the patient. No language interpreter was used.   HPI Comments: Natalie Snyder is a 50 y.o. female who presents to the Emergency Department with a PMHx of DM and CVA complaining of dizziness described as a spinning sensation that began 1 week ago. Pt also reports dark colored urine. Her symptoms began after she drove back from seeing [place] in [place] one week ago. Only when she stands does she have this spinning sensation accompanied by a head pressure from her head to her ears. Shortly after standing, her symptoms resolve on their own. She was seen by her PCP today who suspects she is dehydrated. Pt denies any weakness, speech difficulties, appetite changes, visual disturbances, N/V/D, dysuria, or any other symptoms at this time. Pt was recently placed on a new BP medication within the past 1 week.   Pt had two strokes in August 2017. At that time, the symptom she was having was right sided numbness. She reports residual right sided numbness from her right shoulder to her right toes since. Pt denies experiencing similar symptoms today. Pt has been on Plavix since August 2017.  Past Medical History:  Diagnosis Date  . Diabetes mellitus without complication (HCC)   . Herpes   . Stroke [facility](HCC)    Patient Active Problem List   Diagnosis Date Noted  . Thyroid nodule 05/23/2016  . CVA (cerebral infarction) 05/22/2016  . HTN (hypertension) 05/22/2016  . Obesity (BMI 30-39.9) 05/22/2016  . Acute CVA (cerebrovascular accident) (HCC) 05/22/2016    History reviewed. No  pertinent surgical history.  OB History    No data available     Home Medications    Prior to Admission medications   Medication Sig Start Date End Date Taking? Authorizing Provider  aspirin 325 MG tablet Take 650 mg by mouth once.    Historical Provider, MD  atorvastatin (LIPITOR) 40 MG tablet Take 1 tablet (40 mg total) by mouth daily at 6 PM. 05/23/16   Houston SirenPeter Le, MD  Cholecalciferol (VITAMIN D PO) Take 1 tablet by mouth daily.    Historical Provider, MD  clopidogrel (PLAVIX) 75 MG tablet Take 1 tablet (75 mg total) by mouth daily. 05/23/16   Houston SirenPeter Le, MD  lisinopril (PRINIVIL,ZESTRIL) 20 MG tablet Take 1 tablet (20 mg total) by mouth daily. 05/23/16   Houston SirenPeter Le, MD  Omega-3 Fatty Acids (FISH OIL PO) Take 3 capsules by mouth daily.    Historical Provider, MD    Family History No family history on file.  Social History Social History  Substance Use Topics  . Smoking status: Never Smoker  . Smokeless tobacco: Never Used  . Alcohol use No     Allergies   Latex   Review of Systems Review of Systems  Constitutional: Negative for appetite change.  HENT: Negative for congestion.   Eyes: Negative for visual disturbance.  Gastrointestinal: Negative for nausea and vomiting.  Genitourinary: Negative for dysuria.  Neurological: Positive for dizziness. Negative for speech difficulty, weakness, numbness and headaches.  All other systems reviewed and are negative.  Physical Exam Updated Vital Signs  BP 146/87 (BP Location: Right Arm)   Pulse 103   Temp 98.5 F (36.9 C) (Oral)   Resp 24   Ht 5\' 5"  (1.651 m)   Wt 279 lb (126.6 kg)   SpO2 99%   BMI 46.43 kg/m   Physical Exam  Constitutional: She is oriented to person, place, and time. She appears well-developed and well-nourished. No distress.  HENT:  Head: Normocephalic and atraumatic.  Eyes: EOM are normal.  Neck: Normal range of motion.  Cardiovascular: Normal rate, regular rhythm and normal heart sounds.   Pulmonary/Chest:  Effort normal and breath sounds normal.  Abdominal: Soft. She exhibits no distension. There is no tenderness.  Musculoskeletal: Normal range of motion.  Neurological: She is alert and oriented to person, place, and time.  Skin: Skin is warm and dry.  Psychiatric: She has a normal mood and affect. Judgment normal.  Nursing note and vitals reviewed.  ED Treatments / Results  DIAGNOSTIC STUDIES: Oxygen Saturation is 99% on room air, normal by my interpretation.    COORDINATION OF CARE: 8:29 PM Discussed treatment plan with pt at bedside and pt agreed to plan.  Labs (all labs ordered are listed, but only abnormal results are displayed) Labs Reviewed - No data to display  EKG  EKG Interpretation  Date/Time:  Monday September 14 2016 19:40:54 EST Ventricular Rate:  102 PR Interval:    QRS Duration: 84 QT Interval:  337 QTC Calculation: 439 R Axis:   -31 Text Interpretation:  Sinus tachycardia Ventricular premature complex Inferior infarct, old Anterior infarct, old since last tracing no significant change Confirmed by Johnrobert Foti  MD, Joselinne Lawal ([id number]) on 09/14/2016 8:01:10 PM       Radiology No results found.  Procedures Procedures (including critical care time)  Medications Ordered in ED Medications - No data to display   Initial Impression / Assessment and Plan / ED Course  I have reviewed the triage vital signs and the nursing notes.  Pertinent labs & imaging results that were available during my care of the patient were reviewed by me and considered in my medical decision making (see chart for details).  Clinical Course    The pt has only had sx with standing There is some orthostatic changes Ketones in urine Fluids with good improvement Labs reassuring, minimal leukocytosis  Final Clinical Impressions(s) / ED Diagnoses   Final diagnoses:  Near syncope  Dehydration    New Prescriptions New Prescriptions   No medications on file   I personally performed the  services described in this documentation, which was scribed in my presence. The recorded information has been reviewed and is accurate.       Eber HongBrian Juaquin Ludington, MD 09/14/16 2308

## 2016-09-14 NOTE — ED Triage Notes (Signed)
Pt c/o dizziness with head pressure that has been going on for a while. Pt was seen at pcp today.

## 2016-09-21 ENCOUNTER — Inpatient Hospital Stay (HOSPITAL_COMMUNITY): Payer: 59

## 2016-09-21 ENCOUNTER — Emergency Department (HOSPITAL_COMMUNITY): Payer: 59

## 2016-09-21 ENCOUNTER — Inpatient Hospital Stay (HOSPITAL_COMMUNITY)
Admission: EM | Admit: 2016-09-21 | Discharge: 2016-09-23 | DRG: 065 | Disposition: A | Discharge status: Home / Self Care | Payer: 59 | Attending: Nephrology | Admitting: Nephrology

## 2016-09-21 ENCOUNTER — Encounter (HOSPITAL_COMMUNITY): Payer: Self-pay | Admitting: Emergency Medicine

## 2016-09-21 DIAGNOSIS — IMO0002 Reserved for concepts with insufficient information to code with codable children: Secondary | ICD-10-CM | POA: Diagnosis present

## 2016-09-21 DIAGNOSIS — Z91040 Latex allergy status: Secondary | ICD-10-CM | POA: Diagnosis not present

## 2016-09-21 DIAGNOSIS — E118 Type 2 diabetes mellitus with unspecified complications: Secondary | ICD-10-CM

## 2016-09-21 DIAGNOSIS — E785 Hyperlipidemia, unspecified: Secondary | ICD-10-CM | POA: Diagnosis present

## 2016-09-21 DIAGNOSIS — E1151 Type 2 diabetes mellitus with diabetic peripheral angiopathy without gangrene: Secondary | ICD-10-CM | POA: Diagnosis present

## 2016-09-21 DIAGNOSIS — I639 Cerebral infarction, unspecified: Principal | ICD-10-CM | POA: Diagnosis present

## 2016-09-21 DIAGNOSIS — R29810 Facial weakness: Secondary | ICD-10-CM | POA: Diagnosis present

## 2016-09-21 DIAGNOSIS — Z7902 Long term (current) use of antithrombotics/antiplatelets: Secondary | ICD-10-CM | POA: Diagnosis not present

## 2016-09-21 DIAGNOSIS — R2689 Other abnormalities of gait and mobility: Secondary | ICD-10-CM

## 2016-09-21 DIAGNOSIS — I10 Essential (primary) hypertension: Secondary | ICD-10-CM | POA: Diagnosis present

## 2016-09-21 DIAGNOSIS — I6789 Other cerebrovascular disease: Secondary | ICD-10-CM | POA: Diagnosis not present

## 2016-09-21 DIAGNOSIS — E1165 Type 2 diabetes mellitus with hyperglycemia: Secondary | ICD-10-CM | POA: Diagnosis present

## 2016-09-21 DIAGNOSIS — I69351 Hemiplegia and hemiparesis following cerebral infarction affecting right dominant side: Secondary | ICD-10-CM

## 2016-09-21 DIAGNOSIS — E669 Obesity, unspecified: Secondary | ICD-10-CM | POA: Diagnosis present

## 2016-09-21 DIAGNOSIS — G8194 Hemiplegia, unspecified affecting left nondominant side: Secondary | ICD-10-CM | POA: Diagnosis present

## 2016-09-21 DIAGNOSIS — Z79899 Other long term (current) drug therapy: Secondary | ICD-10-CM | POA: Diagnosis not present

## 2016-09-21 DIAGNOSIS — Z8249 Family history of ischemic heart disease and other diseases of the circulatory system: Secondary | ICD-10-CM

## 2016-09-21 DIAGNOSIS — G459 Transient cerebral ischemic attack, unspecified: Secondary | ICD-10-CM

## 2016-09-21 HISTORY — DX: Essential (primary) hypertension: I10

## 2016-09-21 LAB — CBC
HCT: 41.7 % (ref 36.0–46.0)
HEMOGLOBIN: 14.8 g/dL (ref 12.0–15.0)
MCH: 31.1 pg (ref 26.0–34.0)
MCHC: 35.5 g/dL (ref 30.0–36.0)
MCV: 87.6 fL (ref 78.0–100.0)
PLATELETS: 278 10*3/uL (ref 150–400)
RBC: 4.76 MIL/uL (ref 3.87–5.11)
RDW: 13.3 % (ref 11.5–15.5)
WBC: 12.3 10*3/uL — ABNORMAL HIGH (ref 4.0–10.5)

## 2016-09-21 LAB — COMPREHENSIVE METABOLIC PANEL
ALK PHOS: 70 U/L (ref 38–126)
ALT: 42 U/L (ref 14–54)
ANION GAP: 11 (ref 5–15)
AST: 32 U/L (ref 15–41)
Albumin: 4.2 g/dL (ref 3.5–5.0)
BILIRUBIN TOTAL: 0.4 mg/dL (ref 0.3–1.2)
BUN: 13 mg/dL (ref 6–20)
CALCIUM: 9.6 mg/dL (ref 8.9–10.3)
CO2: 24 mmol/L (ref 22–32)
Chloride: 100 mmol/L — ABNORMAL LOW (ref 101–111)
Creatinine, Ser: 0.6 mg/dL (ref 0.44–1.00)
GFR calc non Af Amer: >60 mL/min (ref >60)
Glucose, Bld: 201 mg/dL — ABNORMAL HIGH (ref 65–99)
Potassium: 3.9 mmol/L (ref 3.5–5.1)
Sodium: 135 mmol/L (ref 135–145)
TOTAL PROTEIN: 8 g/dL (ref 6.5–8.1)

## 2016-09-21 LAB — PROTIME-INR
INR: 0.97
PROTHROMBIN TIME: 12.9 s (ref 11.4–15.2)

## 2016-09-21 LAB — RAPID URINE DRUG SCREEN, HOSP PERFORMED
Amphetamines: NOT DETECTED
BARBITURATES: NOT DETECTED
Benzodiazepines: NOT DETECTED
Cocaine: NOT DETECTED
Opiates: NOT DETECTED
Tetrahydrocannabinol: NOT DETECTED

## 2016-09-21 LAB — URINALYSIS, ROUTINE W REFLEX MICROSCOPIC
Bilirubin Urine: NEGATIVE
GLUCOSE, UA: NEGATIVE mg/dL
HGB URINE DIPSTICK: NEGATIVE
Ketones, ur: NEGATIVE mg/dL
LEUKOCYTES UA: NEGATIVE
Nitrite: NEGATIVE
PH: 5.5 (ref 5.0–8.0)
PROTEIN: NEGATIVE mg/dL
SPECIFIC GRAVITY, URINE: 1.025 (ref 1.005–1.030)

## 2016-09-21 LAB — DIFFERENTIAL
Basophils Absolute: 0 10*3/uL (ref 0.0–0.1)
Basophils Relative: 0 %
EOS PCT: 4 %
Eosinophils Absolute: 0.5 10*3/uL (ref 0.0–0.7)
LYMPHS ABS: 2.6 10*3/uL (ref 0.7–4.0)
LYMPHS PCT: 22 %
MONO ABS: 0.9 10*3/uL (ref 0.1–1.0)
Monocytes Relative: 7 %
Neutro Abs: 8.3 10*3/uL — ABNORMAL HIGH (ref 1.7–7.7)
Neutrophils Relative %: 67 %

## 2016-09-21 LAB — GLUCOSE, CAPILLARY: GLUCOSE-CAPILLARY: 170 mg/dL — AB (ref 65–99)

## 2016-09-21 LAB — I-STAT TROPONIN, ED: Troponin i, poc: 0.01 ng/mL (ref 0.00–0.08)

## 2016-09-21 LAB — APTT: aPTT: 27 seconds (ref 24–36)

## 2016-09-21 MED ORDER — ACETAMINOPHEN 325 MG PO TABS: 650.0000 mg | ORAL_TABLET | ORAL | Status: DC | PRN

## 2016-09-21 MED ORDER — ATORVASTATIN CALCIUM 40 MG PO TABS
40.0000 mg | ORAL_TABLET | Freq: Every day | ORAL | Status: DC
  Administered 2016-09-21 – 2016-09-22 (×2): 40 mg via ORAL
  Filled 2016-09-21 (×2): qty 1

## 2016-09-21 MED ORDER — INSULIN ASPART 100 UNIT/ML ~~LOC~~ SOLN
0.0000 [IU] | Freq: Every day | SUBCUTANEOUS | Status: DC
  Administered 2016-09-22: 4 [IU] via SUBCUTANEOUS

## 2016-09-21 MED ORDER — STROKE: EARLY STAGES OF RECOVERY BOOK
Freq: Once | Status: AC
  Administered 2016-09-22: 10:00:00
  Filled 2016-09-21: qty 1

## 2016-09-21 MED ORDER — SODIUM CHLORIDE 0.9 % IV SOLN
INTRAVENOUS | Status: DC
  Administered 2016-09-21 – 2016-09-23 (×3): via INTRAVENOUS

## 2016-09-21 MED ORDER — SENNOSIDES-DOCUSATE SODIUM 8.6-50 MG PO TABS: 1.0000 | ORAL_TABLET | Freq: Every evening | ORAL | Status: DC | PRN

## 2016-09-21 MED ORDER — INSULIN ASPART 100 UNIT/ML ~~LOC~~ SOLN
0.0000 [IU] | Freq: Three times a day (TID) | SUBCUTANEOUS | Status: DC
  Administered 2016-09-22: 15 [IU] via SUBCUTANEOUS
  Administered 2016-09-22: 4 [IU] via SUBCUTANEOUS
  Administered 2016-09-22: 15 [IU] via SUBCUTANEOUS
  Administered 2016-09-23 (×2): 7 [IU] via SUBCUTANEOUS

## 2016-09-21 MED ORDER — IOPAMIDOL (ISOVUE-370) INJECTION 76%
100.0000 mL | Freq: Once | INTRAVENOUS | Status: AC | PRN
  Administered 2016-09-21: 100 mL via INTRAVENOUS

## 2016-09-21 MED ORDER — ASPIRIN EC 81 MG PO TBEC
81.0000 mg | DELAYED_RELEASE_TABLET | Freq: Every day | ORAL | Status: DC
  Administered 2016-09-21 – 2016-09-23 (×3): 81 mg via ORAL
  Filled 2016-09-21 (×3): qty 1

## 2016-09-21 MED ORDER — ACETAMINOPHEN 650 MG RE SUPP: 650.0000 mg | RECTAL | Status: DC | PRN

## 2016-09-21 MED ORDER — ENOXAPARIN SODIUM 40 MG/0.4ML ~~LOC~~ SOLN
40.0000 mg | SUBCUTANEOUS | Status: DC
Start: 2016-09-21 — End: 2016-09-23
  Administered 2016-09-21 – 2016-09-22 (×2): 40 mg via SUBCUTANEOUS
  Filled 2016-09-21 (×2): qty 0.4

## 2016-09-21 MED ORDER — PANTOPRAZOLE SODIUM 40 MG PO TBEC
40.0000 mg | DELAYED_RELEASE_TABLET | Freq: Every day | ORAL | Status: DC
  Administered 2016-09-21 – 2016-09-23 (×3): 40 mg via ORAL
  Filled 2016-09-21 (×3): qty 1

## 2016-09-21 MED ORDER — ESCITALOPRAM OXALATE 10 MG PO TABS
10.0000 mg | ORAL_TABLET | Freq: Every day | ORAL | Status: DC
  Administered 2016-09-22 – 2016-09-23 (×2): 10 mg via ORAL
  Filled 2016-09-21 (×2): qty 1

## 2016-09-21 NOTE — ED Provider Notes (Signed)
[facility]-EMER[facility]ENCY DEPT Provider Note   CSN: [id number] Arrival date & time: 09/21/16  1218     History   Chief Complaint Chief Complaint  Patient presents with  . Facial Droop    HPI Natalie Snyder is a 50 y.o. female.  HPI Pt with hx of DM, L thalamus stroke within the last month who presents to the ER with cc of R sided facial droop and L sided numbness. Pt reports that she noticed the facial drooping on Sat and the symptoms have gotten worse. Pt also has L sided numbness in her upper and lower extremity, and states that it feels "warm" when touched in those area. Pt also has been having intermittent dizziness x 2 weeks, no dizziness at the moment.   Past Medical History:  Diagnosis Date  . Diabetes mellitus without complication (HCC)   . Herpes   . Stroke [facility])     Patient Active Problem List   Diagnosis Date Noted  . Stroke (cerebrum) (HCC) 09/21/2016  . Thyroid nodule 05/23/2016  . CVA (cerebral infarction) 05/22/2016  . HTN (hypertension) 05/22/2016  . Obesity (BMI 30-39.9) 05/22/2016  . Acute CVA (cerebrovascular accident) (HCC) 05/22/2016    History reviewed. No pertinent surgical history.  OB History    Gravida Para Term Preterm AB Living             1   SAB TAB Ectopic Multiple Live Births                   Home Medications    Prior to Admission medications   Medication Sig Start Date End Date Taking? Authorizing Provider  amLODipine (NORVASC) 10 MG tablet Take 10 mg by mouth daily.  09/09/16  Yes Historical Provider, MD  atorvastatin (LIPITOR) 40 MG tablet Take 1 tablet (40 mg total) by mouth daily at 6 PM. 05/23/16  Yes Houston Siren, MD  cholecalciferol (VITAMIN D) 1000 units tablet Take 1,000 Units by mouth daily.   Yes Historical Provider, MD  clopidogrel (PLAVIX) 75 MG tablet Take 1 tablet (75 mg total) by mouth daily. Patient taking differently: Take 75 mg by mouth every evening.  05/23/16  Yes Houston Siren, MD  escitalopram (LEXAPRO) 10 MG tablet Take 10 mg  by mouth daily.  09/11/16  Yes Historical Provider, MD  GLIPIZIDE XL 10 MG 24 hr tablet Take 10 mg by mouth daily with breakfast.  08/24/16  Yes Historical Provider, MD  lisinopril-hydrochlorothiazide (PRINZIDE,ZESTORETIC) 20-12.5 MG tablet Take 1 tablet by mouth daily.  08/17/16  Yes Historical Provider, MD  metFORMIN (GLUCOPHAGE-XR) 500 MG 24 hr tablet Take 500 mg by mouth 4 (four) times daily.  07/27/16  Yes Historical Provider, MD  metoprolol succinate (TOPROL-XL) 50 MG 24 hr tablet Take 50 mg by mouth daily.  08/24/16  Yes Historical Provider, MD    Family History History reviewed. No pertinent family history.  Social History Social History  Substance Use Topics  . Smoking status: Never Smoker  . Smokeless tobacco: Never Used  . Alcohol use No     Allergies   Latex   Review of Systems Review of Systems  ROS 10 Systems reviewed and are negative for acute change except as noted in the HPI.     Physical Exam Updated Vital Signs BP 142/83   Pulse 99   Temp 98.5 F (36.9 C) (Oral)   Resp 19   Ht 5\' 5"  (1.651 m)   Wt 279 lb (126.6 kg)   SpO2 98%  BMI 46.43 kg/m   Physical Exam  Constitutional: She is oriented to person, place, and time. She appears well-developed.  HENT:  Head: Normocephalic and atraumatic.  Eyes: Conjunctivae and EOM are normal. Pupils are equal, round, and reactive to light.  Neck: Normal range of motion. Neck supple.  Cardiovascular: Normal rate, regular rhythm and normal heart sounds.   Pulmonary/Chest: Effort normal and breath sounds normal. No respiratory distress.  Abdominal: Soft. Bowel sounds are normal. She exhibits no distension. There is no tenderness. There is no rebound and no guarding.  Neurological: She is alert and oriented to person, place, and time. A cranial nerve deficit and sensory deficit is present. Coordination normal.  R sided nasolabial flattening. LUE and LLE subjective numbness. Cerebellar exam is normal - finger to  nose. [id number] - 2  Skin: Skin is warm and dry.  Nursing note and vitals reviewed.    ED Treatments / Results  Labs (all labs ordered are listed, but only abnormal results are displayed) Labs Reviewed  CBC - Abnormal; Notable for the following:       Result Value   WBC 12.3 (*)    All other components within normal limits  DIFFERENTIAL - Abnormal; Notable for the following:    Neutro Abs 8.3 (*)    All other components within normal limits  COMPREHENSIVE METABOLIC PANEL - Abnormal; Notable for the following:    Chloride 100 (*)    Glucose, Bld 201 (*)    All other components within normal limits  PROTIME-INR  APTT  URINALYSIS, ROUTINE W REFLEX MICROSCOPIC (NOT AT [facility])  I-STAT TROPOININ, ED    EKG  EKG Interpretation None       Radiology Ct Head Wo Contrast  Result Date: 09/21/2016 CLINICAL DATA:  Left site numbness, right side facial drooping EXAM: CT HEAD WITHOUT CONTRAST TECHNIQUE: Contiguous axial images were obtained from the base of the skull through the vertex without intravenous contrast. COMPARISON:  CT scan 05/22/2016 FINDINGS: Brain: No intracranial hemorrhage, mass effect or midline shift. Right frontal high convexity arachnoid cyst is stable. No definite acute cortical infarction. No mass lesion is noted on this unenhanced scan. Small lacunar infarct left thalamus. Vascular: No hyperdense vessel or unexpected calcification. Skull: Normal. Negative for fracture or focal lesion. Sinuses/Orbits: No acute finding. Other: None. IMPRESSION: No acute intracranial abnormality. No definite acute cortical infarction. Electronically Signed   By: Natasha MeadLiviu  Pop M.D.   On: 09/21/2016 17:25   Mr Brain Wo Contrast  Result Date: 09/21/2016 CLINICAL DATA:  Dizziness and head pressure. Right-sided facial droop. Left-sided numbness. EXAM: MRI HEAD WITHOUT CONTRAST TECHNIQUE: Multiplanar, multiecho pulse sequences of the brain and surrounding structures were obtained without intravenous  contrast. COMPARISON:  MRI brain 05/22/2016 FINDINGS: Brain: Diffusion-weighted images suggest an acute/subacute nonhemorrhagic infarct within the inferior right pons. This area is obscured by motion on the coronal diffusion-weighted images. Associated T2 changes are present. No acute supratentorial infarct is present. There is expected evolution of the previously seen infarct within the left thalamus and posterior limb internal capsule. The brainstem and cerebellum are otherwise unremarkable. The internal auditory canals are within normal limits. Vascular: Flow is present in the major intracranial arteries. Skull and upper cervical spine: The skullbase is within normal limits. Midline sagittal structures are unremarkable. Sinuses/Orbits: The paranasal sinuses and mastoid air cells are clear. Globes and orbits are within normal limits. IMPRESSION: 1. Acute/subacute infarct in the right posterior inferior pons. 2. Expected evolution of infarcts in the left basal ganglia.  3. Mild atrophy and white matter disease reflects the sequela of chronic microvascular ischemia. Electronically Signed   By: Marin Robertshristopher  Mattern M.D.   On: 09/21/2016 17:20    Procedures Procedures (including critical care time)  Medications Ordered in ED Medications - No data to display   Initial Impression / Assessment and Plan / ED Course  I have reviewed the triage vital signs and the nursing notes.  Pertinent labs & imaging results that were available during my care of the patient were reviewed by me and considered in my medical decision making (see chart for details).  Clinical Course     Pt with recent CVA and hx of DM comes in with cc of numbness, dizziness and R sided facial droop. The right sided facial droop and L sided numbness started on Saturday. Dizziness has been intermittent - no current dizzieness. CT is neg. Concerns for acute stroke still, MRI ordered and it shows a new stroke in the R posterior / inferior  pons. Pt is 2+ days out since last normal and not a TPA candidate.  Final Clinical Impressions(s) / ED Diagnoses   Final diagnoses:  Acute ischemic stroke [facility](HCC)    New Prescriptions New Prescriptions   No medications on file     Derwood KaplanAnkit Tariana Moldovan, MD 09/21/16 1844

## 2016-09-21 NOTE — H&P (Signed)
History and Physical    Natalie Snyder QIW:[id number] DOB: 1966/03/01 DOA: 09/21/2016  PCP: Gar Ponto, MD Consultants:  None Patient coming from: home - lives with husband and son; NOK: husband, [phone]  Chief Complaint: facial droop  HPI: Natalie Snyder is a 50 y.o. female with medical history significant of HTN, DM, and CVA in 8/17 with residual mild R-sided sensory-motor symptoms presenting with another stroke.  Headaches and dizziness have been happening for a week or more; was seen at ER for headache a week ago.  Saturday, entire left side went numb but everything feels warm (sensory deficit).  R facial droop started yesterday.  Headache not currently present, but has moved all over her head.  No dysphagia.  Slight trouble with certain pronunciations because her lip won't move right.  No new motor deficits.  Still numb from toe to head on her right side.  Still having some difficulty with ambulation resulting from that.  Did not have facial droop prior.  Primarily balance issue.  CVA on August 24, thought to be related to BP.  Has been taking her BP meds since.  Also taking Plavix daily (not ASA due to GI upset).  Was taking 81 mg ASA until about 1-2 weeks ago - stopped due to GI upset.  Also stopped fish oil.  Burning acid coming up.  Mid-epigastric discomfort.  Quit ASA and fish oil simultaneously and has better since.   ED Course:  Per Dr. Kathrynn Humble: Pt with recent CVA and hx of DM comes in with cc of numbness, dizziness and R sided facial droop.  The right sided facial droop and L sided numbness started on Saturday.  Dizziness has been intermittent - no current dizzieness.  CT is neg. Concerns for acute stroke still, MRI ordered and it shows a new stroke in the R posterior / inferior pons.  Pt is 2+ days out since last normal and not a TPA candidate.   Review of Systems: As per HPI; otherwise 10 point review of systems reviewed and negative.   Ambulatory Status:  ambulates  without assistance  Past Medical History:  Diagnosis Date  . Diabetes mellitus without complication ([place])   . Herpes   . Hypertension   . Stroke [facility]) 05/2016    Past Surgical History:  Procedure Laterality Date  . CESAREAN SECTION      Social History   Social History  . Marital status: Married    Spouse name: N/A  . Number of children: N/A  . Years of education: N/A   Occupational History  . unemployed - CNA    Social History Main Topics  . Smoking status: Never Smoker  . Smokeless tobacco: Never Used  . Alcohol use No  . Drug use: No  . Sexual activity: Not on file   Other Topics Concern  . Not on file   Social History Narrative  . No narrative on file    Allergies  Allergen Reactions  . Latex Rash    Family History  Problem Relation Age of Onset  . Pulmonary embolism Mother 61  . CAD Father     5 MIs, first while in 41s    Prior to Admission medications   Medication Sig Start Date End Date Taking? Authorizing Provider  amLODipine (NORVASC) 10 MG tablet Take 10 mg by mouth daily.  09/09/16  Yes Historical Provider, MD  atorvastatin (LIPITOR) 40 MG tablet Take 1 tablet (40 mg total) by mouth daily at 6 PM. 05/23/16  Yes Orvan Falconer, MD  cholecalciferol (VITAMIN D) 1000 units tablet Take 1,000 Units by mouth daily.   Yes Historical Provider, MD  clopidogrel (PLAVIX) 75 MG tablet Take 1 tablet (75 mg total) by mouth daily. Patient taking differently: Take 75 mg by mouth every evening.  05/23/16  Yes Orvan Falconer, MD  escitalopram (LEXAPRO) 10 MG tablet Take 10 mg by mouth daily.  09/11/16  Yes Historical Provider, MD  GLIPIZIDE XL 10 MG 24 hr tablet Take 10 mg by mouth daily with breakfast.  08/24/16  Yes Historical Provider, MD  lisinopril-hydrochlorothiazide (PRINZIDE,ZESTORETIC) 20-12.5 MG tablet Take 1 tablet by mouth daily.  08/17/16  Yes Historical Provider, MD  metFORMIN (GLUCOPHAGE-XR) 500 MG 24 hr tablet Take 500 mg by mouth 4 (four) times daily.  07/27/16   Yes Historical Provider, MD  metoprolol succinate (TOPROL-XL) 50 MG 24 hr tablet Take 50 mg by mouth daily.  08/24/16  Yes Historical Provider, MD    Physical Exam: Vitals:   09/21/16 1747 09/21/16 1805 09/21/16 1830 09/21/16 1930  BP: 119/83  142/83 (!) 151/84  Pulse: 99  99   Resp: '18  19 18  '$ Temp:  98.5 F (36.9 C)  98.2 F (36.8 C)  TempSrc:  Oral  Oral  SpO2: 96%  98% 99%  Weight:    128.2 kg (282 lb 10.1 oz)  Height:    '5\' 5"'$  (1.651 m)     General:  Appears calm and comfortable and is NAD Eyes:  PERRL, EOMI, normal lids, iris ENT:  grossly normal hearing, lips & tongue, mmm; possible very subtle tongue deviation to left Neck:  no LAD, masses or thyromegaly Cardiovascular:  RRR, no m/r/g. No LE edema.  Respiratory:  CTA bilaterally, no w/r/r. Normal respiratory effort. Abdomen:  soft, ntnd, NABS Skin:  no rash or induration seen on limited exam Musculoskeletal:  grossly normal tone BUE/BLE other than subtle right hip extensor weakness, good ROM, no bony abnormality Psychiatric:  grossly normal mood and affect, speech fluent and appropriate, AOx3 Neurologic:  R-sided facial droop involving V1-V3, decreased sensation yet with a warm feeling along left side (both extremities), moves all extremities in coordinated fashion  Labs on Admission: I have personally reviewed following labs and imaging studies  CBC:  Recent Labs Lab 09/21/16 1621  WBC 12.3*  NEUTROABS 8.3*  HGB 14.8  HCT 41.7  MCV 87.6  PLT 400   Basic Metabolic Panel:  Recent Labs Lab 09/21/16 1621  NA 135  K 3.9  CL 100*  CO2 24  GLUCOSE 201*  BUN 13  CREATININE 0.60  CALCIUM 9.6   GFR: Estimated Creatinine Clearance: 114.8 mL/min (by C-G formula based on SCr of 0.6 mg/dL). Liver Function Tests:  Recent Labs Lab 09/21/16 1621  AST 32  ALT 42  ALKPHOS 70  BILITOT 0.4  PROT 8.0  ALBUMIN 4.2   No results for input(s): LIPASE, AMYLASE in the last 168 hours. No results for input(s):  AMMONIA in the last 168 hours. Coagulation Profile:  Recent Labs Lab 09/21/16 1621  INR 0.97   Cardiac Enzymes: No results for input(s): CKTOTAL, CKMB, CKMBINDEX, TROPONINI in the last 168 hours. BNP (last 3 results) No results for input(s): PROBNP in the last 8760 hours. HbA1C: No results for input(s): HGBA1C in the last 72 hours. CBG:  Recent Labs Lab 09/21/16 2122  GLUCAP 170*   Lipid Profile: No results for input(s): CHOL, HDL, LDLCALC, TRIG, CHOLHDL, LDLDIRECT in the last 72 hours. Thyroid Function Tests:  No results for input(s): TSH, T4TOTAL, FREET4, T3FREE, THYROIDAB in the last 72 hours. Anemia Panel: No results for input(s): VITAMINB12, FOLATE, FERRITIN, TIBC, IRON, RETICCTPCT in the last 72 hours. Urine analysis:    Component Value Date/Time   COLORURINE YELLOW 09/21/2016 1555   APPEARANCEUR CLEAR 09/21/2016 1555   LABSPEC 1.025 09/21/2016 1555   PHURINE 5.5 09/21/2016 1555   GLUCOSEU NEGATIVE 09/21/2016 1555   HGBUR NEGATIVE 09/21/2016 1555   BILIRUBINUR NEGATIVE 09/21/2016 [place] 09/21/2016 1555   PROTEINUR NEGATIVE 09/21/2016 1555   UROBILINOGEN 0.2 05/15/2014 1404   NITRITE NEGATIVE 09/21/2016 1555   LEUKOCYTESUR NEGATIVE 09/21/2016 1555    Creatinine Clearance: Estimated Creatinine Clearance: 114.8 mL/min (by C-G formula based on SCr of 0.6 mg/dL).  Sepsis Labs: '@LABRCNTIP'$ (procalcitonin:4,lacticidven:4) )No results found for this or any previous visit (from the past 240 hour(s)).   Radiological Exams on Admission: Ct Head Wo Contrast  Result Date: 09/21/2016 CLINICAL DATA:  Left site numbness, right side facial drooping EXAM: CT HEAD WITHOUT CONTRAST TECHNIQUE: Contiguous axial images were obtained from the base of the skull through the vertex without intravenous contrast. COMPARISON:  CT scan 05/22/2016 FINDINGS: Brain: No intracranial hemorrhage, mass effect or midline shift. Right frontal high convexity arachnoid cyst is  stable. No definite acute cortical infarction. No mass lesion is noted on this unenhanced scan. Small lacunar infarct left thalamus. Vascular: No hyperdense vessel or unexpected calcification. Skull: Normal. Negative for fracture or focal lesion. Sinuses/Orbits: No acute finding. Other: None. IMPRESSION: No acute intracranial abnormality. No definite acute cortical infarction. Electronically Signed   By: Lahoma Crocker M.D.   On: 09/21/2016 17:25   Mr Brain Wo Contrast  Result Date: 09/21/2016 CLINICAL DATA:  Dizziness and head pressure. Right-sided facial droop. Left-sided numbness. EXAM: MRI HEAD WITHOUT CONTRAST TECHNIQUE: Multiplanar, multiecho pulse sequences of the brain and surrounding structures were obtained without intravenous contrast. COMPARISON:  MRI brain 05/22/2016 FINDINGS: Brain: Diffusion-weighted images suggest an acute/subacute nonhemorrhagic infarct within the inferior right pons. This area is obscured by motion on the coronal diffusion-weighted images. Associated T2 changes are present. No acute supratentorial infarct is present. There is expected evolution of the previously seen infarct within the left thalamus and posterior limb internal capsule. The brainstem and cerebellum are otherwise unremarkable. The internal auditory canals are within normal limits. Vascular: Flow is present in the major intracranial arteries. Skull and upper cervical spine: The skullbase is within normal limits. Midline sagittal structures are unremarkable. Sinuses/Orbits: The paranasal sinuses and mastoid air cells are clear. Globes and orbits are within normal limits. IMPRESSION: 1. Acute/subacute infarct in the right posterior inferior pons. 2. Expected evolution of infarcts in the left basal ganglia. 3. Mild atrophy and white matter disease reflects the sequela of chronic microvascular ischemia. Electronically Signed   By: San Morelle M.D.   On: 09/21/2016 17:20    EKG: Independently reviewed.  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 -This is particularly concerning given that she has been consistent with HTN medications as well as Plavix -She did discontinue ASA about 1-2 weeks ago; this may mean that she either needs the anti-platelet effects  from ASA or she may have had a rebound effect from rapid discontinuation of ASA -Either way, she has now failed Plavix and really has not option but to find a way to tolerate ASA -Will add daily PPI to try to improve GI discomfort and she can continue to use prn H2 blockers -Will admit for further CVA evaluation -Telemetry monitoring -MRI done -Carotid dopplers were done on prior hospitalization and so should not need to be repeated -Will do CTA head/neck for further discernment of potential issues -Echo was not done prior and so will be ordered now; if negative, Dr. Cheral Marker recommends a TEE to r/o an atrial appendage or other potential cause of CVA -Risk stratification with FLP, A1c again -Patient discussed with Dr. Cheral Marker.  Will also do a hypercoagulable panel; ESR/CRP/ANA for vasculitis evaluation to complete the stroke work-up in the young. -PT/OT/ST/Nutrition consults.  HTN -Allow permissive HTN -Hold CCC, ACE, and BB and plan to restart in 48-72 hours  HLD -On Lipitor 40 mg daily, will continue -Check FLP  DM -Hold oral agents -Realistically, with A1c 9.9 prior and ongoing glucose measurements in the 200s, this patient is likely to benefit from initiation of insulin therapy -For now, will cover with SSI -Another consideration would be a SGLT2 inhibitor like  Invokana, which may also help with weight loss while improving glycemic control  Obesity -We also had a very frank discussion about her weight.  -She reports approximately 35 pounds of weight loss in the last year. -I recommended gastric bypass but she would like to give a good effort at diet/exercise first. -She understands that it may be difficult to exercise with her sensory and motor deficits.  DVT prophylaxis: Lovenox  Code Status: Full - confirmed with patient/family Family Communication: Son and other friends present throughout evaluation Disposition Plan:  Home once clinically improved Consults called: Neuro (by telephone overnight, Dr. Merlene Laughter in AM)  Admission status: Admit - It is my clinical opinion that admission to INPATIENT is reasonable and necessary because this patient will require at least 2 midnights in the hospital to treat this condition based on the medical complexity of the problems presented.  Given the aforementioned information, the predictability of an adverse outcome is felt to be significant.    Karmen Bongo MD [facility]  If 7PM-7AM, please contact night-coverage [email or web address] Password [facility]  09/21/2016, 9:32 PM

## 2016-09-21 NOTE — ED Notes (Signed)
Per dr Ophelia Charteryates, wait until assessed by her before she goes to floor.

## 2016-09-21 NOTE — ED Notes (Signed)
Pt taken to MRI  

## 2016-09-21 NOTE — ED Triage Notes (Signed)
PT states history of two left sided strokes with right sided weakness at times. PT states her right side of her face has felt heavy and dropping to right side of bottom lip x2 days after having nausea and vomiting. PT denies any further deficients and states has also had nasal congestion for about a week.

## 2016-09-22 ENCOUNTER — Inpatient Hospital Stay (HOSPITAL_COMMUNITY): Payer: 59

## 2016-09-22 DIAGNOSIS — I10 Essential (primary) hypertension: Secondary | ICD-10-CM

## 2016-09-22 DIAGNOSIS — I6789 Other cerebrovascular disease: Secondary | ICD-10-CM

## 2016-09-22 DIAGNOSIS — E669 Obesity, unspecified: Secondary | ICD-10-CM

## 2016-09-22 LAB — GLUCOSE, CAPILLARY
GLUCOSE-CAPILLARY: 309 mg/dL — AB (ref 65–99)
GLUCOSE-CAPILLARY: 329 mg/dL — AB (ref 65–99)
Glucose-Capillary: 178 mg/dL — ABNORMAL HIGH (ref 65–99)
Glucose-Capillary: 338 mg/dL — ABNORMAL HIGH (ref 65–99)

## 2016-09-22 LAB — LIPID PANEL
CHOLESTEROL: 138 mg/dL (ref 0–200)
HDL: 36 mg/dL — ABNORMAL LOW (ref >40)
LDL Cholesterol: 36 mg/dL (ref 0–99)
TRIGLYCERIDES: 332 mg/dL — AB (ref <150)
Total CHOL/HDL Ratio: 3.8 RATIO
VLDL: 66 mg/dL — ABNORMAL HIGH (ref 0–40)

## 2016-09-22 LAB — ECHOCARDIOGRAM COMPLETE
Height: 65 in
Weight: 4522.08 oz

## 2016-09-22 LAB — ANTITHROMBIN III: ANTITHROMB III FUNC: 97 % (ref 75–120)

## 2016-09-22 LAB — SEDIMENTATION RATE: Sed Rate: 33 mm/hr — ABNORMAL HIGH (ref 0–22)

## 2016-09-22 LAB — C-REACTIVE PROTEIN: CRP: 0.9 mg/dL (ref <1.0)

## 2016-09-22 MED ORDER — ZOLPIDEM TARTRATE 5 MG PO TABS
5.0000 mg | ORAL_TABLET | Freq: Every evening | ORAL | Status: DC | PRN
Start: 2016-09-22 — End: 2016-09-23
  Administered 2016-09-22: 5 mg via ORAL
  Filled 2016-09-22: qty 1

## 2016-09-22 MED ORDER — INSULIN GLARGINE 100 UNIT/ML ~~LOC~~ SOLN
20.0000 [IU] | Freq: Every day | SUBCUTANEOUS | Status: DC
  Administered 2016-09-22: 20 [IU] via SUBCUTANEOUS
  Filled 2016-09-22 (×2): qty 0.2

## 2016-09-22 NOTE — Evaluation (Signed)
Physical Therapy Evaluation Patient Details Name: Natalie Snyder MRN: [id number] DOB: [birth year] Today's Date: 09/22/2016   History of Present Illness  Natalie Snyder is a 50 y.o. female with medical history significant of HTN, DM, and CVA in 8/17 with residual mild R-sided sensory-motor symptoms presenting with another stroke.  Headaches and dizziness have been happening for a week or more; was seen at ER for headache a week ago.  Saturday, entire left side went numb but everything feels warm (sensory deficit).  R facial droop started yesterday.  Headache not currently present, but has moved all over her head.  No dysphagia.  Slight trouble with certain pronunciations because her lip won't move right.  No new motor deficits.  MRI - shows posterior/inferior pons infarct.   Clinical Impression  Pt received sitting up on the EOB, her aunt present, and pt is agreeable to PT evaluation.  Pt expressed that prior to admission, she was independent with ambulation, ADL's, IADL's, & driving.  She has been helping care for her father with preparing meals and assisting with bills.  She had also been going to OPPT after her previous CVA, but has not been for ~57monthdue to being sick.  During today's PT evaluation, she demonstrates strength WFL, however, c/o a warm feeling over the entire L side of her body.  She ambulated 504f however demonstrated 1 significant LOB where she caught herself on the wall.  She is recommended to continue with f/u OPPT for further balance and endurance training.      Follow Up Recommendations Outpatient PT    Equipment Recommendations  None recommended by PT    Recommendations for Other Services       Precautions / Restrictions Precautions Precautions: Fall Precaution Comments: LOB during PT today Restrictions Weight Bearing Restrictions: No      Mobility  Bed Mobility Overal bed mobility:  (not fully observed - pt already sitting on the EOB upon entering the room.  )                Transfers Overall transfer level: Modified independent Equipment used: None                Ambulation/Gait Ambulation/Gait assistance: Supervision Ambulation Distance (Feet): 500 Feet Assistive device: None Gait Pattern/deviations: Step-through pattern;Wide base of support     General Gait Details: 1 LOB with noted R LE incoordination at times.  Pt maintained close proximity to the wall, and was able to catch herself on the wall.  Stairs            Wheelchair Mobility    Modified Rankin (Stroke Patients Only)       Balance           Standing balance support: No upper extremity supported Standing balance-Leahy Scale: Fair                               Pertinent Vitals/Pain Pain Assessment: No/denies pain    Home Living   Living Arrangements: Spouse/significant other;Children (son is 2643o)   Type of Home: House Home Access: Stairs to enter   [employer] Steps: 2-3 steps.  Home Layout: Two level (bedroom upstairs.  ) Home Equipment: Walker - 2 wheels      Prior Function Level of Independence: Independent      ADL's / Homemaking Assistance Needed: driving, community ambulator.    Comments: Pt reports she had been going  to OPPT, however she had not been for ~1 month due to issues with being sick.  Pt looks after her dad - bills, preparing food.      Hand Dominance   Dominant Hand: Right    Extremity/Trunk Assessment   Upper Extremity Assessment: Defer to OT evaluation           Lower Extremity Assessment: RLE deficits/detail;LLE deficits/detail RLE Deficits / Details: Strength is WFL LLE Deficits / Details: Pt reports a warm sensation on her entire L side of her body.     Communication   Communication: No difficulties  Cognition Arousal/Alertness: Awake/alert Behavior During Therapy: WFL for tasks assessed/performed Overall Cognitive Status: Within Functional Limits for tasks  assessed                      General Comments      Exercises     Assessment/Plan    PT Assessment All further PT needs can be met in the next venue of care  PT Problem List Decreased balance;Decreased coordination;Impaired sensation          PT Treatment Interventions      PT Goals (Current goals can be found in the Care Plan section)  Acute Rehab PT Goals PT Goal Formulation: All assessment and education complete, DC therapy    Frequency     Barriers to discharge        Co-evaluation               End of Session Equipment Utilized During Treatment: Gait belt Activity Tolerance: Patient tolerated treatment well Patient left: with call bell/phone within reach;with family/visitor present (sitting on the EOB.) Nurse Communication: Mobility status Margreta Journey, RN informed of pt's mobiltiy status)         Time: 8242-3536 PT Time Calculation (min) (ACUTE ONLY): 13 min   Charges:   PT Evaluation $PT Eval Low Complexity: 1 Procedure     PT G Codes:        Beth Matis Monnier, PT, DPT X: [phone]

## 2016-09-22 NOTE — Progress Notes (Signed)
Dr Gerilyn Pilgrimoonquah, neurologist is unavailable for consult. Dr. Kerry HoughMemon notified.

## 2016-09-22 NOTE — Progress Notes (Signed)
*  PRELIMINARY RESULTS* Echocardiogram 2D Echocardiogram has been performed.  Jeryl Columbialliott, Selah Zelman 09/22/2016, 9:38 AM

## 2016-09-22 NOTE — Evaluation (Signed)
Occupational Therapy Evaluation Patient Details Name: Natalie Snyder MRN: [id number] DOB: 1966/02/28 Today's Date: 09/22/2016    History of Present Illness Natalie Snyder is a 50 y.o. female with medical history significant of HTN, DM, and CVA in 8/17 with residual mild R-sided sensory-motor symptoms presenting with another stroke.  Headaches and dizziness have been happening for a week or more; was seen at ER for headache a week ago.  Saturday, entire left side went numb but everything feels warm (sensory deficit).  R facial droop started yesterday.  Headache not currently present, but has moved all over her head.  No dysphagia.  Slight trouble with certain pronunciations because her lip won't move right.  No new motor deficits.   Clinical Impression   Pt awake, alert, oriented x4 this am, agreeable to OT evaluation. Pt reports only deficit is sensation with LUE. Light touch, stereognosis, proprioception are intact, hot/cold is impaired. Everything feels warm to pt including various water temperatures and cold items; unable to test hot. Pt is at baseline with ADL tasks, educated pt on maintaining awareness of sensory deficits with tasks such as bathing, cooking, and protection from weather elements. Also educated on energy conservation as pt reports she fatigues easily. No further OT services required at this time.     Follow Up Recommendations  No OT follow up    Equipment Recommendations  None recommended by OT       Precautions / Restrictions Precautions Precautions: None Restrictions Weight Bearing Restrictions: No      Mobility Bed Mobility Overal bed mobility: Modified Independent                          ADL Overall ADL's : Modified independent;At baseline                                             Vision Vision Assessment?: No apparent visual deficits          Pertinent Vitals/Pain Pain Assessment: No/denies pain     Hand  Dominance Right   Extremity/Trunk Assessment Upper Extremity Assessment Upper Extremity Assessment: RUE deficits/detail;LUE deficits/detail RUE Deficits / Details: Sensory deficits from previous CVA; light touch, hot/cold, stereognosis intact LUE Deficits / Details: Pt reports sensory deficit-everything she touches feels warm including cold item out of refridgerator. Pt able to feel OT's hands were cold with right hand/arm, reports hands feel warm when OT touched left arm/hand   Lower Extremity Assessment Lower Extremity Assessment: Defer to PT evaluation   Cervical / Trunk Assessment Cervical / Trunk Assessment: Normal   Communication Communication Communication: No difficulties   Cognition Arousal/Alertness: Awake/alert Behavior During Therapy: WFL for tasks assessed/performed Overall Cognitive Status: Within Functional Limits for tasks assessed                                Home Living Family/patient expects to be discharged to:: Private residence Living Arrangements: Spouse/significant other;Children Available Help at Discharge: Family;Available PRN/intermittently               Bathroom Shower/Tub: Walk-in Human resources officershower   Bathroom Toilet: Standard     Home Equipment: Walker - 2 wheels          Prior Functioning/Environment Level of Independence: Independent  OT Problem List: Impaired sensation    End of Session    Activity Tolerance: Patient tolerated treatment well Patient left: in chair;with call bell/phone within reach;with family/visitor present   Time: [id number] OT Time Calculation (min): 16 min Charges:  OT General Charges $OT Visit: 1 Procedure OT Evaluation $OT Eval Low Complexity: 1 Procedure Ezra SitesLeslie Troxler, OTR/L  [phone] 09/22/2016, 9:05 AM

## 2016-09-22 NOTE — Care Management Note (Signed)
Case Management Note  Patient Details  Name: Natalie Snyder MRN: [id number] Date of Birth: Aug 24, 1966  Subjective/Objective:  Patient adm from home with acute CVA. CVA in the past and has been going to OP PT. PT has recommended OP PT again. Patient agreeable. Will send referral to [facility].                   Action/Plan: Anticipate DC home with self care. No other CM needs.   Expected Discharge Date:     09/22/2016             Expected Discharge Plan:  Home/Self Care (OP PT)  In-House Referral:  NA  Discharge planning Services  CM Consult  Post Acute Care Choice:    Choice offered to:  Patient  DME Arranged:    DME Agency:     HH Arranged:    HH Agency:     Status of Service:  In process, will continue to follow  If discussed at Long Length of Stay Meetings, dates discussed:    Additional Comments:  Anishka Bushard, Chrystine OilerSharley Diane, RN 09/22/2016, 12:45 PM

## 2016-09-22 NOTE — Progress Notes (Signed)
SLP Cancellation Note  Patient Details Name: Natalie Snyder MRN: [id number] DOB: Jan 15, 1966   Cancelled treatment:       Reason Eval/Treat Not Completed: SLP screened, no needs identified, will sign off; Pt reports return to baseline functioning with no acute changes in swallowing, cognition, language, or speech (except for occasional difficulty saying words that begin with /f/). Pt tells SLP that since her stroke in August, she has had to write things down in order to remember them. SLP will defer SLE at this time due to no significant changes. SLP advised to notify her PCP if she notices difficulties in any of the above stated areas once she is discharged home.  Thank you,  Havery MorosDabney Porter, CCC-SLP [phone]    PORTER,DABNEY 09/22/2016, 5:30 PM

## 2016-09-22 NOTE — Progress Notes (Signed)
PROGRESS NOTE    Natalie Snyder  ZOX:[id number] DOB: Feb 09, 1966 DOA: 09/21/2016 PCP: Donzetta Sprung, MD    Brief Narrative:  50 year old female with history of hypertension diabetes and previous stroke in 05/2016 and residual right-sided numbness, presented with facial droop and new left-sided numbness. Workup revealed a new right-sided CVA in the pons. She had CT angiogram imaging of the head and neck should not show any critical lesions. Case was discussed with Dr. Amada Jupiter on call for neurology who recommended risk factor modification and to continue antiplatelet agent. She'll be started on Lantus for better control of diabetes. Anticipate discharge in the next 24 hours   Assessment & Plan:   Principal Problem:   Acute CVA (cerebrovascular accident) (HCC) Active Problems:   HTN (hypertension)   Obesity (BMI 30-39.9)   Diabetes mellitus type 2, uncontrolled, with complications (HCC)   1. Acute CVA. This is the patient's second CVA in the past several months. After her first CVA in 05/2016 she was prescribed aspirin and Plavix. She reported taking dual antiplatelet therapy until approximately 2-3 weeks ago when she discontinued aspirin due to GI upset. Workup here shows echocardiogram that is unremarkable, circulation and the head and neck are also unrevealing. LDL is below 70. Blood sugars have been uncontrolled, but she'll be started on insulin therapy. Images reviewed and case discussed with Dr. Amada Jupiter on call for neurology at [facility]. Recommendations were to continue on dual anti- platelet therapy if tolerated at this point. It appeared that her strokes related to small vessel disease and recommendations with her risk factor management. Hypercoagulable panel has been sent and can be followed up as an outpatient.  2. Diabetes. Uncontrolled. She is on Glucotrol and metformin at home. Reports her blood sugars run high. Hemoglobin A1c pending. She would benefit from being started on  insulin therapy. We'll start her on Lantus. She will need education prior to discharge home.  3. Hypertension. Relatively stable. Continue home regimen.  4. Hyperlipidemia. LDL is below goal. Continue statin   DVT prophylaxis: lovenox Code Status: full Family Communication: discussed with family at the bedside Disposition Plan: discharge home once improved   Consultants:   Neurology (phone)  Procedures:  Echo: - Moderate LVH with LVEF 60-65% and grossly normal diastolic    function. Mildly calcified aortic annulus. No obvious PFO or ASD.  Antimicrobials:       Subjective: Still has some residual numbness on left side. Overall feeling better  Objective: Vitals:   09/22/16 0730 09/22/16 1130 09/22/16 1530 09/22/16 1730  BP: 132/74 137/78 123/75 (!) 152/63  Pulse: 85 95 98 (!) 107  Resp: 18 20 20 20   Temp: 97.9 F (36.6 C) 98.8 F (37.1 C) 99.3 F (37.4 C) 98.7 F (37.1 C)  TempSrc: Oral Oral Oral Oral  SpO2: 97% 99% 99% 98%  Weight:      Height:        Intake/Output Summary (Last 24 hours) at 09/22/16 1912 Last data filed at 09/22/16 1800  Gross per 24 hour  Intake           2475.5 ml  Output                0 ml  Net           2475.5 ml   Filed Weights   09/21/16 1249 09/21/16 1930  Weight: 126.6 kg (279 lb) 128.2 kg (282 lb 10.1 oz)    Examination:  General exam: Appears calm and comfortable  Respiratory system: Clear to auscultation. Respiratory effort normal. Cardiovascular system: S1 & S2 heard, RRR. No JVD, murmurs, rubs, gallops or clicks. No pedal edema. Gastrointestinal system: Abdomen is nondistended, soft and nontender. No organomegaly or masses felt. Normal bowel sounds heard. Central nervous system: Alert and oriented. No focal neurological deficits. Extremities: Symmetric 5 x 5 power. Skin: No rashes, lesions or ulcers Psychiatry: Judgement and insight appear normal. Mood & affect appropriate.     Data Reviewed: I have personally  reviewed following labs and imaging studies  CBC:  Recent Labs Lab 09/21/16 1621  WBC 12.3*  NEUTROABS 8.3*  HGB 14.8  HCT 41.7  MCV 87.6  PLT 278   Basic Metabolic Panel:  Recent Labs Lab 09/21/16 1621  NA 135  K 3.9  CL 100*  CO2 24  GLUCOSE 201*  BUN 13  CREATININE 0.60  CALCIUM 9.6   GFR: Estimated Creatinine Clearance: 114.8 mL/min (by C-G formula based on SCr of 0.6 mg/dL). Liver Function Tests:  Recent Labs Lab 09/21/16 1621  AST 32  ALT 42  ALKPHOS 70  BILITOT 0.4  PROT 8.0  ALBUMIN 4.2   No results for input(s): LIPASE, AMYLASE in the last 168 hours. No results for input(s): AMMONIA in the last 168 hours. Coagulation Profile:  Recent Labs Lab 09/21/16 1621  INR 0.97   Cardiac Enzymes: No results for input(s): CKTOTAL, CKMB, CKMBINDEX, TROPONINI in the last 168 hours. BNP (last 3 results) No results for input(s): PROBNP in the last 8760 hours. HbA1C: No results for input(s): HGBA1C in the last 72 hours. CBG:  Recent Labs Lab 09/21/16 2122 09/22/16 0733 09/22/16 1125 09/22/16 1627  GLUCAP 170* 178* 309* 329*   Lipid Profile:  Recent Labs  09/22/16 0544  CHOL 138  HDL 36*  LDLCALC 36  TRIG 161332*  CHOLHDL 3.8   Thyroid Function Tests: No results for input(s): TSH, T4TOTAL, FREET4, T3FREE, THYROIDAB in the last 72 hours. Anemia Panel: No results for input(s): VITAMINB12, FOLATE, FERRITIN, TIBC, IRON, RETICCTPCT in the last 72 hours. Sepsis Labs: No results for input(s): PROCALCITON, LATICACIDVEN in the last 168 hours.  No results found for this or any previous visit (from the past 240 hour(s)).       Radiology Studies: Ct Angio Head W Or Wo Contrast  Result Date: 09/21/2016 CLINICAL DATA:  Headache and dizziness for 1 week. Intermittent LEFT-sided numbness, RIGHT facial droop beginning yesterday. Residual RIGHT-sided deficits from stroke August 2017. History of hypertension and diabetes. EXAM: CT ANGIOGRAPHY HEAD AND  NECK TECHNIQUE: Multidetector CT imaging of the head and neck was performed using the standard protocol during bolus administration of intravenous contrast. Multiplanar CT image reconstructions and MIPs were obtained to evaluate the vascular anatomy. Carotid stenosis measurements (when applicable) are obtained utilizing NASCET criteria, using the distal internal carotid diameter as the denominator. CONTRAST:  100 cc Isovue 370 COMPARISON:  CT HEAD September 21, 2016 at 1719 hours and MRI head September 21, 2016 at 1714 hours and MRA head May 22, 2016. FINDINGS: CTA NECK AORTIC ARCH: Normal appearance of the thoracic arch, 2 vessel arch is a normal variant. The origins of the innominate, left Common carotid artery and subclavian artery are widely patent. RIGHT CAROTID SYSTEM: Common carotid artery is widely patent, coursing in a straight line fashion. Normal appearance of the carotid bifurcation without hemodynamically significant stenosis by NASCET criteria. Normal appearance of the included internal carotid artery. LEFT CAROTID SYSTEM: Common carotid artery is widely patent, coursing in a straight  line fashion. Normal appearance of the carotid bifurcation without hemodynamically significant stenosis by NASCET criteria. Normal appearance of the included internal carotid artery. VERTEBRAL ARTERIES:Codominant vertebral artery's. Normal appearance of the vertebral arteries, which appear widely patent. SKELETON: No acute osseous process though bone windows have not been submitted. Multilevel mild degenerative discs without osseous canal stenosis or neural foraminal narrowing. OTHER NECK: Soft tissues of the neck are non-acute though, not tailored for evaluation. CTA HEAD ANTERIOR CIRCULATION: Normal appearance of the cervical internal carotid arteries, petrous, cavernous and supra clinoid internal carotid arteries. Atherosclerosis, moderate stenosis bilateral supraclinoid internal carotid arteries origins. Widely patent  anterior communicating artery. Supernumerary anterior cerebral artery arising from LEFT A1-2 junction. Patent anterior and middle cerebral arteries. Mild stenosis proximal LEFT M2 segment, moderate stenosis distal bilateral M2 segments. Mild luminal regularity. No large vessel occlusion, hemodynamically significant stenosis, dissection, contrast extravasation or aneurysm. POSTERIOR CIRCULATION: Normal appearance of the vertebral arteries, vertebrobasilar junction and basilar artery, as well as main branch vessels. Normal appearance of the posterior cerebral arteries. Small bilateral posterior communicating arteries present. Moderate tandem stenosis bilateral proximal P1 segments, RIGHT mid P2 segment. Mild luminal regularity. No large vessel occlusion, hemodynamically significant stenosis, dissection, contrast extravasation or aneurysm. VENOUS SINUSES: Major dural venous sinuses are patent though not tailored for evaluation on this angiographic examination. ANATOMIC VARIANTS: None. DELAYED PHASE: No abnormal intracranial enhancement. IMPRESSION: CTA NECK:  Negative. CTA HEAD:  No emergent large vessel occlusion. Mild general intracranial atherosclerosis. Superimposed moderate stenosis bilateral supraclinoid internal carotid arteries, bilateral middle and posterior cerebral arteries. Electronically Signed   By: Awilda Metro M.D.   On: 09/21/2016 23:58   Ct Head Wo Contrast  Result Date: 09/21/2016 CLINICAL DATA:  Left site numbness, right side facial drooping EXAM: CT HEAD WITHOUT CONTRAST TECHNIQUE: Contiguous axial images were obtained from the base of the skull through the vertex without intravenous contrast. COMPARISON:  CT scan 05/22/2016 FINDINGS: Brain: No intracranial hemorrhage, mass effect or midline shift. Right frontal high convexity arachnoid cyst is stable. No definite acute cortical infarction. No mass lesion is noted on this unenhanced scan. Small lacunar infarct left thalamus. Vascular: No  hyperdense vessel or unexpected calcification. Skull: Normal. Negative for fracture or focal lesion. Sinuses/Orbits: No acute finding. Other: None. IMPRESSION: No acute intracranial abnormality. No definite acute cortical infarction. Electronically Signed   By: Natasha Mead M.D.   On: 09/21/2016 17:25   Ct Angio Neck W Or Wo Contrast  Result Date: 09/21/2016 CLINICAL DATA:  Headache and dizziness for 1 week. Intermittent LEFT-sided numbness, RIGHT facial droop beginning yesterday. Residual RIGHT-sided deficits from stroke August 2017. History of hypertension and diabetes. EXAM: CT ANGIOGRAPHY HEAD AND NECK TECHNIQUE: Multidetector CT imaging of the head and neck was performed using the standard protocol during bolus administration of intravenous contrast. Multiplanar CT image reconstructions and MIPs were obtained to evaluate the vascular anatomy. Carotid stenosis measurements (when applicable) are obtained utilizing NASCET criteria, using the distal internal carotid diameter as the denominator. CONTRAST:  100 cc Isovue 370 COMPARISON:  CT HEAD September 21, 2016 at 1719 hours and MRI head September 21, 2016 at 1714 hours and MRA head May 22, 2016. FINDINGS: CTA NECK AORTIC ARCH: Normal appearance of the thoracic arch, 2 vessel arch is a normal variant. The origins of the innominate, left Common carotid artery and subclavian artery are widely patent. RIGHT CAROTID SYSTEM: Common carotid artery is widely patent, coursing in a straight line fashion. Normal appearance of the carotid bifurcation without hemodynamically  significant stenosis by NASCET criteria. Normal appearance of the included internal carotid artery. LEFT CAROTID SYSTEM: Common carotid artery is widely patent, coursing in a straight line fashion. Normal appearance of the carotid bifurcation without hemodynamically significant stenosis by NASCET criteria. Normal appearance of the included internal carotid artery. VERTEBRAL ARTERIES:Codominant vertebral  artery's. Normal appearance of the vertebral arteries, which appear widely patent. SKELETON: No acute osseous process though bone windows have not been submitted. Multilevel mild degenerative discs without osseous canal stenosis or neural foraminal narrowing. OTHER NECK: Soft tissues of the neck are non-acute though, not tailored for evaluation. CTA HEAD ANTERIOR CIRCULATION: Normal appearance of the cervical internal carotid arteries, petrous, cavernous and supra clinoid internal carotid arteries. Atherosclerosis, moderate stenosis bilateral supraclinoid internal carotid arteries origins. Widely patent anterior communicating artery. Supernumerary anterior cerebral artery arising from LEFT A1-2 junction. Patent anterior and middle cerebral arteries. Mild stenosis proximal LEFT M2 segment, moderate stenosis distal bilateral M2 segments. Mild luminal regularity. No large vessel occlusion, hemodynamically significant stenosis, dissection, contrast extravasation or aneurysm. POSTERIOR CIRCULATION: Normal appearance of the vertebral arteries, vertebrobasilar junction and basilar artery, as well as main branch vessels. Normal appearance of the posterior cerebral arteries. Small bilateral posterior communicating arteries present. Moderate tandem stenosis bilateral proximal P1 segments, RIGHT mid P2 segment. Mild luminal regularity. No large vessel occlusion, hemodynamically significant stenosis, dissection, contrast extravasation or aneurysm. VENOUS SINUSES: Major dural venous sinuses are patent though not tailored for evaluation on this angiographic examination. ANATOMIC VARIANTS: None. DELAYED PHASE: No abnormal intracranial enhancement. IMPRESSION: CTA NECK:  Negative. CTA HEAD:  No emergent large vessel occlusion. Mild general intracranial atherosclerosis. Superimposed moderate stenosis bilateral supraclinoid internal carotid arteries, bilateral middle and posterior cerebral arteries. Electronically Signed   By:  Awilda Metroourtnay  Bloomer M.D.   On: 09/21/2016 23:58   Mr Brain Wo Contrast  Result Date: 09/21/2016 CLINICAL DATA:  Dizziness and head pressure. Right-sided facial droop. Left-sided numbness. EXAM: MRI HEAD WITHOUT CONTRAST TECHNIQUE: Multiplanar, multiecho pulse sequences of the brain and surrounding structures were obtained without intravenous contrast. COMPARISON:  MRI brain 05/22/2016 FINDINGS: Brain: Diffusion-weighted images suggest an acute/subacute nonhemorrhagic infarct within the inferior right pons. This area is obscured by motion on the coronal diffusion-weighted images. Associated T2 changes are present. No acute supratentorial infarct is present. There is expected evolution of the previously seen infarct within the left thalamus and posterior limb internal capsule. The brainstem and cerebellum are otherwise unremarkable. The internal auditory canals are within normal limits. Vascular: Flow is present in the major intracranial arteries. Skull and upper cervical spine: The skullbase is within normal limits. Midline sagittal structures are unremarkable. Sinuses/Orbits: The paranasal sinuses and mastoid air cells are clear. Globes and orbits are within normal limits. IMPRESSION: 1. Acute/subacute infarct in the right posterior inferior pons. 2. Expected evolution of infarcts in the left basal ganglia. 3. Mild atrophy and white matter disease reflects the sequela of chronic microvascular ischemia. Electronically Signed   By: Marin Robertshristopher  Mattern M.D.   On: 09/21/2016 17:20        Scheduled Meds: . aspirin EC  81 mg Oral Daily  . atorvastatin  40 mg Oral q1800  . enoxaparin (LOVENOX) injection  40 mg Subcutaneous Q24H  . escitalopram  10 mg Oral Daily  . insulin aspart  0-20 Units Subcutaneous TID WC  . insulin aspart  0-5 Units Subcutaneous QHS  . pantoprazole  40 mg Oral Daily   Continuous Infusions: . sodium chloride 50 mL/hr at 09/22/16 1702  LOS: 1 day    Time spent:     Kieanna Rollo, MD [facility] Pager [phone]  If 7PM-7AM, please contact night-coverage [email or web address] Password [email or web address] 09/22/2016, 7:12 PM

## 2016-09-22 NOTE — Progress Notes (Signed)
Nutrition Brief Note  RD was consulted due to pt's hx of CVA.   Wt Readings from Last 15 Encounters:  09/21/16 282 lb 10.1 oz (128.2 kg)  09/14/16 279 lb (126.6 kg)  05/22/16 290 lb 4.8 oz (131.7 kg)  05/15/14 (!) 301 lb (136.5 kg)   Body mass index is 47.03 kg/m. Patient meets criteria for Morbidly obese based on current BMI.   Pt is seen up in bed laughing with multiple family members. She says she has largely returned to her baseline and now "Everything is how it was before the stroke". Denies any decrease in appetite, n/v/c/d.   RD had intended to discuss wt loss strategies, but deferred due to large presence of other individuals in room.   Per MD note, she appears motivated to lose weight. If she is agreeable, may benefit from a [facility].   No nutrition interventions warranted at this time. If nutrition issues arise, please consult RD.   Christophe LouisNathan Franks RD, LDN, CNSC Clinical Nutrition Pager: [phone] 09/22/2016 12:06 PM

## 2016-09-23 DIAGNOSIS — I639 Cerebral infarction, unspecified: Principal | ICD-10-CM

## 2016-09-23 DIAGNOSIS — E118 Type 2 diabetes mellitus with unspecified complications: Secondary | ICD-10-CM

## 2016-09-23 DIAGNOSIS — E1165 Type 2 diabetes mellitus with hyperglycemia: Secondary | ICD-10-CM

## 2016-09-23 LAB — CBC
HEMATOCRIT: 36.9 % (ref 36.0–46.0)
HEMOGLOBIN: 12.8 g/dL (ref 12.0–15.0)
MCH: 30.7 pg (ref 26.0–34.0)
MCHC: 34.7 g/dL (ref 30.0–36.0)
MCV: 88.5 fL (ref 78.0–100.0)
Platelets: 204 10*3/uL (ref 150–400)
RBC: 4.17 MIL/uL (ref 3.87–5.11)
RDW: 13.3 % (ref 11.5–15.5)
WBC: 8.1 10*3/uL (ref 4.0–10.5)

## 2016-09-23 LAB — HOMOCYSTEINE: HOMOCYSTEINE-NORM: 10.2 umol/L (ref 0.0–15.0)

## 2016-09-23 LAB — GLUCOSE, CAPILLARY
GLUCOSE-CAPILLARY: 235 mg/dL — AB (ref 65–99)
Glucose-Capillary: 224 mg/dL — ABNORMAL HIGH (ref 65–99)

## 2016-09-23 LAB — BASIC METABOLIC PANEL
ANION GAP: 6 (ref 5–15)
BUN: 9 mg/dL (ref 6–20)
CHLORIDE: 103 mmol/L (ref 101–111)
CO2: 27 mmol/L (ref 22–32)
Calcium: 8.6 mg/dL — ABNORMAL LOW (ref 8.9–10.3)
Creatinine, Ser: 0.5 mg/dL (ref 0.44–1.00)
Glucose, Bld: 261 mg/dL — ABNORMAL HIGH (ref 65–99)
POTASSIUM: 3.9 mmol/L (ref 3.5–5.1)
SODIUM: 136 mmol/L (ref 135–145)

## 2016-09-23 LAB — FANA STAINING PATTERNS: Homogeneous Pattern: 1:320 {titer} — ABNORMAL HIGH

## 2016-09-23 LAB — ANTINUCLEAR ANTIBODIES, IFA: ANTINUCLEAR ANTIBODIES, IFA: POSITIVE — AB

## 2016-09-23 LAB — LUPUS ANTICOAGULANT PANEL
DRVVT: 37.8 s (ref 0.0–47.0)
PTT Lupus Anticoagulant: 34.8 s (ref 0.0–51.9)

## 2016-09-23 LAB — PROTEIN S, TOTAL: Protein S Ag, Total: 94 % (ref 60–150)

## 2016-09-23 LAB — PROTEIN S ACTIVITY: Protein S Activity: 120 % (ref 63–140)

## 2016-09-23 LAB — HEMOGLOBIN A1C
HEMOGLOBIN A1C: 9.4 % — AB (ref 4.8–5.6)
Mean Plasma Glucose: 223 mg/dL

## 2016-09-23 LAB — PROTEIN C ACTIVITY: PROTEIN C ACTIVITY: 159 % (ref 73–180)

## 2016-09-23 MED ORDER — ASPIRIN 81 MG PO TBEC: 81.0000 mg | DELAYED_RELEASE_TABLET | Freq: Every day | ORAL | 0 refills | Status: AC

## 2016-09-23 MED ORDER — PANTOPRAZOLE SODIUM 40 MG PO TBEC: 40.0000 mg | DELAYED_RELEASE_TABLET | Freq: Every day | ORAL | 0 refills | Status: AC

## 2016-09-23 MED ORDER — INSULIN STARTER KIT- PEN NEEDLES (ENGLISH)
1.0000 | Freq: Once | Status: DC
  Filled 2016-09-23: qty 1

## 2016-09-23 MED ORDER — INSULIN GLARGINE 100 UNIT/ML SOLOSTAR PEN: 25.0000 [IU] | PEN_INJECTOR | Freq: Every day | SUBCUTANEOUS | 0 refills | Status: DC

## 2016-09-23 MED ORDER — INSULIN ASPART 100 UNIT/ML FLEXPEN: 6.0000 [IU] | PEN_INJECTOR | Freq: Three times a day (TID) | SUBCUTANEOUS | 0 refills | Status: DC

## 2016-09-23 NOTE — Progress Notes (Signed)
Inpatient Diabetes Program Recommendations  AACE/ADA: New Consensus Statement on Inpatient Glycemic Control (2015)  Target Ranges:  Prepandial:   less than 140 mg/dL      Peak postprandial:   less than 180 mg/dL (1-2 hours)      Critically ill patients:  140 - 180 mg/dL   Results for Wandra MannanSOMERS, Yuki D (MRN [id number]) as of 09/23/2016 07:33  Ref. Range 09/21/2016 21:22 09/22/2016 07:33 09/22/2016 11:25 09/22/2016 16:27 09/22/2016 21:34  Glucose-Capillary Latest Ref Range: 65 - 99 mg/dL 829170 (H) 562178 (H) 130309 (H) 329 (H) 338 (H)   Review of Glycemic Control  Diabetes history: DM2 Outpatient Diabetes medications: Metformin XR 500 mg QID, Glipizide XL 10 mg QAM Current orders for Inpatient glycemic control: Lantus 20 units QHS, Novolog 0-20 units TID with meals, Novolog 0-5 units QHS  Inpatient Diabetes Program Recommendations: Insulin - Basal: Please consider increasing Lantus to 25 units QHS. Insulin - Meal Coverage: Please consider ordering Novolog 6 units TID with meals for meal coverage if patient eats at least 50% of meal.  Thanks, Orlando PennerMarie Jacayla Nordell, RN, MSN, CDE Diabetes Coordinator Inpatient Diabetes Program [phone] (Team Pager from 8am to 5pm)

## 2016-09-23 NOTE — Progress Notes (Addendum)
Patient will be discharging new to insulin. Discussed Lantus and Novolog and how they work for DM control. Discussed insulin administration with vial/syringe and insulin pens. After showing both, patient states that she prefers to use an insulin pen for insulin administration. Educated patient on insulin pen use at home.  Reviewed all steps of insulin pen including attachment of needle, 2-unit air shot, dialing up dose, giving injection, removing needle, disposal of sharps, storage of unused insulin, disposal of insulin etc. Patient able to provide successful return demonstration. Encouraged patient to also ask pharmacist at her pharmacy to review again when she picks up the insulin. Discussed glucose goals, hypoglycemia, and proper treatment for hypoglycemia.  Patient verbalized understanding of information discussed and states that she has no further questions at this time related to diabetes or insulin. MD to give patient Rxs for insulin pens and insulin pen needles.  Thanks, Orlando PennerMarie Lusine Corlett, RN, MSN, CDE Diabetes Coordinator Inpatient Diabetes Program [phone] (Team Pager from 8am to 5pm)

## 2016-09-23 NOTE — Discharge Summary (Signed)
Physician Discharge Summary  Natalie Snyder ZOX:[id number] DOB: 10-20-1965 DOA: 09/21/2016  PCP: Donzetta SprungERRY DANIEL, MD  Admit date: 09/21/2016 Discharge date: 09/23/2016  Admitted From:home Disposition:home  Recommendations for Outpatient Follow-up:  1. Follow up with PCP in 1-2 weeks 2. Please obtain BMP/CBC in one week 3. Please follow up pending lab result with your PCP and neurologist.  Home Health: No Equipment/Devices: No Discharge Condition: Stable CODE STATUS: Full code Diet recommendation: Carb modified heart healthy diet  Brief/Interim Summary: 50 year old female with history of hypertension diabetes and previous stroke in 05/2016 and residual right-sided numbness, presented with facial droop and new left-sided numbness. Workup revealed a new right-sided CVA in the pons. She had CT angiogram imaging of the head and neck which didn't show any critical lesions. As per yesterday's progress note by Dr. Kerry HoughMemon, the case was discussed with Dr. Amada JupiterKirkpatrick on call for neurology who recommended risk factor modification and to continue antiplatelet agent.   # Acute ischemic CVA: This is the patient's second CVA in the past several months. After her first CVA in 05/2016 she was prescribed aspirin and Plavix. She reported taking dual antiplatelet therapy until approximately 2-3 weeks ago when she discontinued aspirin due to GI upset. Workup here shows echocardiogram that is unremarkable, circulation and the head and neck are also unrevealing. LDL is below 70.Images reviewed and case discussed with Dr. Amada JupiterKirkpatrick on [facility]. Recommendations were to continue on dual anti- platelet therapy if tolerated at this point. It appeared that her strokes related to small vessel disease and recommendations with her risk factor management. Hypercoagulable panel has been sent and can be followed up as an outpatient.  The patient has no weakness, slurred speech or facial droop today. She  reported feeling good. She is in the process of finding a neurologist to follow up outpatient. I discussed about the importance of medications and close follow-up. She will be discharged with aspirin, Plavix, Lipitor, metoprolol. Patient verbalized understanding of follow-up pending lab result either with PCP for possible neurologist.  #Uncontrolled type 2 diabetes: Patient was on oral metformin and Glucotrol at home. A1c level more than 9. Patient was seen by diabetic educator. Treated with insulin in the hospital. Patient is being discharged home with subcutaneous insulin. Education provided about insulin and injection in the hospital by nursing staff. Advised to follow-up with PCP and close monitoring of blood sugar level at home. Also discussed about signs and symptoms of hyper and hypoglycemia with the patient in detail.  Discharge Diagnoses:  Principal Problem:   Acute CVA (cerebrovascular accident) (HCC) Active Problems:   HTN (hypertension)   Obesity (BMI 30-39.9)   Diabetes mellitus type 2, uncontrolled, with complications [facility](HCC)    Discharge Instructions  Discharge Instructions    Ambulatory referral to Neurology    Complete by:  As directed    An appointment is requested in approximately:2-3 weeks   Ambulatory referral to Physical Therapy    Complete by:  As directed    Call MD for:  difficulty breathing, headache or visual disturbances    Complete by:  As directed    Call MD for:  extreme fatigue    Complete by:  As directed    Call MD for:  hives    Complete by:  As directed    Call MD for:  persistant dizziness or light-headedness    Complete by:  As directed    Call MD for:  persistant nausea and vomiting  Complete by:  As directed    Call MD for:  temperature >100.4    Complete by:  As directed    DME Other see comment    Complete by:  As directed    Glucometer 1 Please provide lancets, glucose strips, insulin syringes if needed for one month supply only.   Diet  - low sodium heart healthy    Complete by:  As directed    Diet Carb Modified    Complete by:  As directed    Discharge instructions    Complete by:  As directed    Please monitor blood sugar level at home Check blood sugar level at least 3 times a day Please follow up the pending lab results with her PCP and neurologist.   Increase activity slowly    Complete by:  As directed        Medication List    STOP taking these medications   GLIPIZIDE XL 10 MG 24 hr tablet Generic drug:  glipiZIDE   lisinopril-hydrochlorothiazide 20-12.5 MG tablet Commonly known as:  PRINZIDE,ZESTORETIC     TAKE these medications   amLODipine 10 MG tablet Commonly known as:  NORVASC Take 10 mg by mouth daily.   aspirin 81 MG EC tablet Take 1 tablet (81 mg total) by mouth daily. Start taking on:  09/24/2016   atorvastatin 40 MG tablet Commonly known as:  LIPITOR Take 1 tablet (40 mg total) by mouth daily at 6 PM.   cholecalciferol 1000 units tablet Commonly known as:  VITAMIN D Take 1,000 Units by mouth daily.   clopidogrel 75 MG tablet Commonly known as:  PLAVIX Take 1 tablet (75 mg total) by mouth daily. What changed:  when to take this   escitalopram 10 MG tablet Commonly known as:  LEXAPRO Take 10 mg by mouth daily.   insulin aspart 100 UNIT/ML FlexPen Commonly known as:  NOVOLOG Inject 6 Units into the skin 3 (three) times daily with meals.   Insulin Glargine 100 UNIT/ML Solostar Pen Commonly known as:  LANTUS Inject 25 Units into the skin daily at 10 pm.   metFORMIN 500 MG 24 hr tablet Commonly known as:  GLUCOPHAGE-XR Take 500 mg by mouth 4 (four) times daily.   metoprolol succinate 50 MG 24 hr tablet Commonly known as:  TOPROL-XL Take 50 mg by mouth daily.   pantoprazole 40 MG tablet Commonly known as:  PROTONIX Take 1 tablet (40 mg total) by mouth daily. Start taking on:  09/24/2016            Durable Medical Equipment        Start     Ordered   09/23/16  0000  DME Other see comment    Comments:  Glucometer 1 Please provide lancets, glucose strips, insulin syringes if needed for one month supply only.   09/23/16 1257     Follow-up Information    Donzetta Sprung, MD. Schedule an appointment as soon as possible for a visit in 1 week(s).   Specialty:  Family Medicine Contact information: [address] [place] [address] [phone]        Levert Feinstein, MD. Schedule an appointment as soon as possible for a visit in 1 week(s).   Specialty:  Neurology Why:  or any neurologist that you prefer. Contact information: [address] [place] [address] [phone]          Allergies  Allergen Reactions  . Latex Rash    Consultations: With  neurologist over the phone  Procedures/Studies: Echo, CT  Subjective: Patient was seen and examined at bedside. Patient denied headache, dizziness, nausea, vomiting, weakness. No chest pain or shortness of breath. Learning about insulin injection and management of diabetes. Verbalized understanding of follow-up of pending lab result and neurologist follow-up outpatient. Patient's close friend and son at bedside.   Discharge Exam: Vitals:   09/23/16 0730 09/23/16 1130  BP: 140/84 135/83  Pulse: 95 (!) 102  Resp: 18 18  Temp: 97.8 F (36.6 C) 98.3 F (36.8 C)   Vitals:   09/22/16 2330 09/23/16 0330 09/23/16 0730 09/23/16 1130  BP: 126/74 117/76 140/84 135/83  Pulse: 89 90 95 (!) 102  Resp: 18 18 18 18   Temp: 97.7 F (36.5 C) 97.7 F (36.5 C) 97.8 F (36.6 C) 98.3 F (36.8 C)  TempSrc: Oral Oral Oral Oral  SpO2: 96% 96% 100% 99%  Weight:      Height:        General: Pt is alert, awake, not in acute distress Eye: EOMI, PERLA, no facial droop Cardiovascular: RRR, S1/S2 +, no rubs, no gallops Respiratory: CTA bilaterally, no wheezing, no rhonchi Abdominal: Soft, NT, ND, bowel sounds + Extremities: no edema, no cyanosis Neuro: CN II-XII, muscle strength 5/5, normal sensory  exam Alert awake and following commands   The results of significant diagnostics from this hospitalization (including imaging, microbiology, ancillary and laboratory) are listed below for reference.     Microbiology: No results found for this or any previous visit (from the past 240 hour(s)).   Labs: BNP (last 3 results) No results for input(s): BNP in the last 8760 hours. Basic Metabolic Panel:  Recent Labs Lab 09/21/16 1621 09/23/16 0457  NA 135 136  K 3.9 3.9  CL 100* 103  CO2 24 27  GLUCOSE 201* 261*  BUN 13 9  CREATININE 0.60 0.50  CALCIUM 9.6 8.6*   Liver Function Tests:  Recent Labs Lab 09/21/16 1621  AST 32  ALT 42  ALKPHOS 70  BILITOT 0.4  PROT 8.0  ALBUMIN 4.2   No results for input(s): LIPASE, AMYLASE in the last 168 hours. No results for input(s): AMMONIA in the last 168 hours. CBC:  Recent Labs Lab 09/21/16 1621 09/23/16 0457  WBC 12.3* 8.1  NEUTROABS 8.3*  --   HGB 14.8 12.8  HCT 41.7 36.9  MCV 87.6 88.5  PLT 278 204   Cardiac Enzymes: No results for input(s): CKTOTAL, CKMB, CKMBINDEX, TROPONINI in the last 168 hours. BNP: Invalid input(s): POCBNP CBG:  Recent Labs Lab 09/22/16 1125 09/22/16 1627 09/22/16 2134 09/23/16 0741 09/23/16 1129  GLUCAP 309* 329* 338* 224* 235*   D-Dimer No results for input(s): DDIMER in the last 72 hours. Hgb A1c  Recent Labs  09/21/16 1621  HGBA1C 9.4*   Lipid Profile  Recent Labs  09/22/16 0544  CHOL 138  HDL 36*  LDLCALC 36  TRIG 409332*  CHOLHDL 3.8   Thyroid function studies No results for input(s): TSH, T4TOTAL, T3FREE, THYROIDAB in the last 72 hours.  Invalid input(s): FREET3 Anemia work up No results for input(s): VITAMINB12, FOLATE, FERRITIN, TIBC, IRON, RETICCTPCT in the last 72 hours. Urinalysis    Component Value Date/Time   COLORURINE YELLOW 09/21/2016 1555   APPEARANCEUR CLEAR 09/21/2016 1555   LABSPEC 1.025 09/21/2016 1555   PHURINE 5.5 09/21/2016 1555    GLUCOSEU NEGATIVE 09/21/2016 1555   HGBUR NEGATIVE 09/21/2016 1555   BILIRUBINUR NEGATIVE 09/21/2016 1555   KETONESUR NEGATIVE 09/21/2016  1555   PROTEINUR NEGATIVE 09/21/2016 1555   UROBILINOGEN 0.2 05/15/2014 1404   NITRITE NEGATIVE 09/21/2016 1555   LEUKOCYTESUR NEGATIVE 09/21/2016 1555   Sepsis Labs Invalid input(s): PROCALCITONIN,  WBC,  LACTICIDVEN Microbiology No results found for this or any previous visit (from the past 240 hour(s)).   Time coordinating discharge: Over 30 minutes  SIGNED:   Maxie Barb, MD  [facility] 09/23/2016, 12:58 PM  If 7PM-7AM, please contact night-coverage [email or web address] Password [email or web address]

## 2016-09-23 NOTE — Progress Notes (Signed)
Patient states understanding of discharge, prescriptions given 

## 2016-09-24 ENCOUNTER — Emergency Department (HOSPITAL_COMMUNITY)
Admission: EM | Admit: 2016-09-24 | Discharge: 2016-09-24 | Disposition: A | Discharge status: Home / Self Care | Payer: 59 | Attending: Emergency Medicine | Admitting: Emergency Medicine

## 2016-09-24 ENCOUNTER — Encounter (HOSPITAL_COMMUNITY): Payer: Self-pay

## 2016-09-24 DIAGNOSIS — Z0000 Encounter for general adult medical examination without abnormal findings: Secondary | ICD-10-CM | POA: Insufficient documentation

## 2016-09-24 DIAGNOSIS — Z139 Encounter for screening, unspecified: Secondary | ICD-10-CM

## 2016-09-24 DIAGNOSIS — Z7982 Long term (current) use of aspirin: Secondary | ICD-10-CM | POA: Diagnosis not present

## 2016-09-24 DIAGNOSIS — Z794 Long term (current) use of insulin: Secondary | ICD-10-CM | POA: Diagnosis not present

## 2016-09-24 DIAGNOSIS — T383X1A Poisoning by insulin and oral hypoglycemic [antidiabetic] drugs, accidental (unintentional), initial encounter: Secondary | ICD-10-CM | POA: Diagnosis present

## 2016-09-24 DIAGNOSIS — I10 Essential (primary) hypertension: Secondary | ICD-10-CM | POA: Insufficient documentation

## 2016-09-24 DIAGNOSIS — E119 Type 2 diabetes mellitus without complications: Secondary | ICD-10-CM | POA: Insufficient documentation

## 2016-09-24 DIAGNOSIS — Z79899 Other long term (current) drug therapy: Secondary | ICD-10-CM | POA: Diagnosis not present

## 2016-09-24 LAB — I-STAT CHEM 8, ED
BUN: 9 mg/dL (ref 6–20)
CALCIUM ION: 1.09 mmol/L — AB (ref 1.15–1.40)
CHLORIDE: 98 mmol/L — AB (ref 101–111)
Creatinine, Ser: 0.5 mg/dL (ref 0.44–1.00)
Glucose, Bld: 215 mg/dL — ABNORMAL HIGH (ref 65–99)
HEMATOCRIT: 35 % — AB (ref 36.0–46.0)
Hemoglobin: 11.9 g/dL — ABNORMAL LOW (ref 12.0–15.0)
Potassium: 3.6 mmol/L (ref 3.5–5.1)
SODIUM: 137 mmol/L (ref 135–145)
TCO2: 27 mmol/L (ref 0–100)

## 2016-09-24 LAB — BETA-2-GLYCOPROTEIN I ABS, IGG/M/A
Beta-2 Glyco I IgG: <9 GPI IgG units (ref 0–20)
Beta-2-Glycoprotein I IgM: <9 GPI IgM units (ref 0–32)

## 2016-09-24 LAB — PROTEIN C, TOTAL: Protein C, Total: 104 % (ref 60–150)

## 2016-09-24 LAB — CARDIOLIPIN ANTIBODIES, IGG, IGM, IGA: Anticardiolipin IgA: <9 APL U/mL (ref 0–11)

## 2016-09-24 LAB — CBG MONITORING, ED: GLUCOSE-CAPILLARY: 201 mg/dL — AB (ref 65–99)

## 2016-09-24 NOTE — Discharge Instructions (Signed)
Take your usual prescriptions as previously directed.  Call your regular medical doctor tomorrow to schedule a follow up appointment within the next 2 days.  Return to the Emergency Department immediately sooner if worsening.  ° °

## 2016-09-24 NOTE — ED Provider Notes (Signed)
[facility]-EMERGENCY DEPT Provider Note   CSN: [id number] Arrival date & time: 09/24/16  2023     History   Chief Complaint Chief Complaint  Patient presents with  . Drug Overdose    HPI Natalie Snyder is a 50 y.o. female.   Drug Overdose     Pt was seen at 2050. Per EMS and pt, c/o sudden onset and resolution of one episode of "insulin pen malfunction" that occurred PTA. Pt states she has a new regular insulin pen and was using it tonight at 1930. States she "primed it" then "stuck it in my skin." States when she pressed the button to release the medication "it jammed." States she repeated this 2 more times; each time "the button jammed." Pt is unsure if she gave herself any insulin. States she "got worried" because "if I did give myself insulin it was 18 units." EMS states CBG was "173" en route. Denies any symptoms currently. Denies abd pain, no N/V/D, no CP/SOB, no syncope/near syncope.   Past Medical History:  Diagnosis Date  . Diabetes mellitus without complication (HCC)   . Herpes   . Hypertension   . Stroke [facility](HCC) 05/2016    Patient Active Problem List   Diagnosis Date Noted  . Stroke (cerebrum) (HCC) 09/21/2016  . Diabetes mellitus type 2, uncontrolled, with complications (HCC) 09/21/2016  . Thyroid nodule 05/23/2016  . CVA (cerebral infarction) 05/22/2016  . HTN (hypertension) 05/22/2016  . Obesity (BMI 30-39.9) 05/22/2016  . Acute CVA (cerebrovascular accident) (HCC) 05/22/2016    Past Surgical History:  Procedure Laterality Date  . CESAREAN SECTION      OB History    Gravida Para Term Preterm AB Living             1   SAB TAB Ectopic Multiple Live Births                   Home Medications    Prior to Admission medications   Medication Sig Start Date End Date Taking? Authorizing Provider  amLODipine (NORVASC) 10 MG tablet Take 10 mg by mouth daily.  09/09/16   Historical Provider, MD  aspirin EC 81 MG EC tablet Take 1 tablet (81 mg total) by mouth  daily. 09/24/16   Dron Jaynie CollinsPrasad Bhandari, MD  atorvastatin (LIPITOR) 40 MG tablet Take 1 tablet (40 mg total) by mouth daily at 6 PM. 05/23/16   Houston SirenPeter Le, MD  cholecalciferol (VITAMIN D) 1000 units tablet Take 1,000 Units by mouth daily.    Historical Provider, MD  clopidogrel (PLAVIX) 75 MG tablet Take 1 tablet (75 mg total) by mouth daily. Patient taking differently: Take 75 mg by mouth every evening.  05/23/16   Houston SirenPeter Le, MD  escitalopram (LEXAPRO) 10 MG tablet Take 10 mg by mouth daily.  09/11/16   Historical Provider, MD  insulin aspart (NOVOLOG) 100 UNIT/ML FlexPen Inject 6 Units into the skin 3 (three) times daily with meals. 09/23/16   Dron Jaynie CollinsPrasad Bhandari, MD  Insulin Glargine (LANTUS) 100 UNIT/ML Solostar Pen Inject 25 Units into the skin daily at 10 pm. 09/23/16   Dron Jaynie CollinsPrasad Bhandari, MD  metFORMIN (GLUCOPHAGE-XR) 500 MG 24 hr tablet Take 500 mg by mouth 4 (four) times daily.  07/27/16   Historical Provider, MD  metoprolol succinate (TOPROL-XL) 50 MG 24 hr tablet Take 50 mg by mouth daily.  08/24/16   Historical Provider, MD  pantoprazole (PROTONIX) 40 MG tablet Take 1 tablet (40 mg total) by mouth daily. 09/24/16  Dron Jaynie CollinsPrasad Bhandari, MD    Family History Family History  Problem Relation Age of Onset  . Pulmonary embolism Mother 7472  . CAD Father     5 MIs, first while in 4770s    Social History Social History  Substance Use Topics  . Smoking status: Never Smoker  . Smokeless tobacco: Never Used  . Alcohol use No     Allergies   Latex   Review of Systems Review of Systems ROS: Statement: All systems negative except as marked or noted in the HPI; Constitutional: Negative for fever and chills. ; ; Eyes: Negative for eye pain, redness and discharge. ; ; ENMT: Negative for ear pain, hoarseness, nasal congestion, sinus pressure and sore throat. ; ; Cardiovascular: Negative for chest pain, palpitations, diaphoresis, dyspnea and peripheral edema. ; ; Respiratory: Negative for cough,  wheezing and stridor. ; ; Gastrointestinal: Negative for nausea, vomiting, diarrhea, abdominal pain, blood in stool, hematemesis, jaundice and rectal bleeding. . ; ; Genitourinary: Negative for dysuria, flank pain and hematuria. ; ; Musculoskeletal: Negative for back pain and neck pain. Negative for swelling and trauma.; ; Skin: Negative for pruritus, rash, abrasions, blisters, bruising and skin lesion.; ; Neuro: Negative for headache, lightheadedness and neck stiffness. Negative for weakness, altered level of consciousness, altered mental status, extremity weakness, paresthesias, involuntary movement, seizure and syncope.       Physical Exam Updated Vital Signs BP 156/90 (BP Location: Left Arm)   Pulse 97   Temp 98.2 F (36.8 C) (Oral)   Resp 16   Ht 5\' 5"  (1.651 m)   Wt 279 lb (126.6 kg)   SpO2 92%   BMI 46.43 kg/m   Physical Exam 2055: Physical examination:  Nursing notes reviewed; Vital signs and O2 SAT reviewed;  Constitutional: Well developed, Well nourished, Well hydrated, In no acute distress; Head:  Normocephalic, atraumatic; Eyes: EOMI, PERRL, No scleral icterus; ENMT: Mouth and pharynx normal, Mucous membranes moist; Neck: Supple, Full range of motion, No lymphadenopathy; Cardiovascular: Regular rate and rhythm, No gallop; Respiratory: Breath sounds clear & equal bilaterally, No wheezes.  Speaking full sentences with ease, Normal respiratory effort/excursion; Chest: Nontender, Movement normal; Abdomen: Soft, Nontender, Nondistended, Normal bowel sounds; Genitourinary: No CVA tenderness; Extremities: Pulses normal, No tenderness, No edema, No calf edema or asymmetry.; Neuro: AA&Ox3, Major CN grossly intact.  Speech clear. No gross focal motor deficits in extremities.; Skin: Color normal, Warm, Dry.   ED Treatments / Results  Labs (all labs ordered are listed, but only abnormal results are displayed)   EKG  EKG Interpretation None        Radiology   Procedures Procedures (including critical care time)  Medications Ordered in ED Medications - No data to display   Initial Impression / Assessment and Plan / ED Course  I have reviewed the triage vital signs and the nursing notes.  Pertinent labs & imaging results that were available during my care of the patient were reviewed by me and considered in my medical decision making (see chart for details).  MDM Reviewed: previous chart, nursing note and vitals Reviewed previous: labs Interpretation: labs    Results for orders placed or performed during the hospital encounter of 09/24/16  POC CBG, ED  Result Value Ref Range   Glucose-Capillary 201 (H) 65 - 99 mg/dL  I-stat Chem 8, ED  Result Value Ref Range   Sodium 137 135 - 145 mmol/L   Potassium 3.6 3.5 - 5.1 mmol/L   Chloride 98 (L) 101 -  111 mmol/L   BUN 9 6 - 20 mg/dL   Creatinine, Ser 9.60 0.44 - 1.00 mg/dL   Glucose, Bld 454 (H) 65 - 99 mg/dL   Calcium, Ion 0.98 (L) 1.15 - 1.40 mmol/L   TCO2 27 0 - 100 mmol/L   Hemoglobin 11.9 (L) 12.0 - 15.0 g/dL   HCT 11.9 (L) 14.7 - 82.9 %    2200:   Pt's family has brought pt's insulin pen. Pen is full (pt did not receive any insulin). Use of pen explained by ED staff. Pt verb understanding; states she is ready to go home now.  Dx and testing d/w pt and family.  Questions answered.  Verb understanding, agreeable to d/c home with outpt f/u.    Final Clinical Impressions(s) / ED Diagnoses   Final diagnoses:  None    New Prescriptions New Prescriptions   No medications on file      Samuel Jester, DO 09/26/16 2252

## 2016-09-24 NOTE — ED Triage Notes (Addendum)
Patient states that she thinks the insulin was Humalog.

## 2016-09-24 NOTE — ED Triage Notes (Signed)
Possibly injected more insulin than needed due to a malfunction of her insulin pen.  It was like it jammed.  Blood glucose by EMS 173.  Did not bring insulin pen with her.  Patient states that she tried 3 different times and may not have injected any insulin, but could have injected up to 18 units of insulin.  Patient states that she became scared and called EMS.

## 2016-09-24 NOTE — ED Notes (Signed)
Patient is calling her son to bring the insulin pen up to the hospital.

## 2016-09-25 LAB — FACTOR 5 LEIDEN

## 2016-09-28 LAB — PROTHROMBIN GENE MUTATION

## 2016-10-09 ENCOUNTER — Ambulatory Visit (HOSPITAL_COMMUNITY): Payer: 59 | Attending: Family Medicine | Admitting: Physical Therapy

## 2016-10-27 ENCOUNTER — Ambulatory Visit: Payer: Self-pay | Admitting: Neurology

## 2016-12-08 ENCOUNTER — Encounter: Payer: Self-pay | Admitting: Neurology

## 2016-12-08 ENCOUNTER — Ambulatory Visit (INDEPENDENT_AMBULATORY_CARE_PROVIDER_SITE_OTHER): Payer: Managed Care, Other (non HMO) | Admitting: Neurology

## 2016-12-08 VITALS — BP 140/88 | HR 90 | Ht 65.0 in | Wt 296.0 lb

## 2016-12-08 DIAGNOSIS — I639 Cerebral infarction, unspecified: Secondary | ICD-10-CM | POA: Diagnosis not present

## 2016-12-08 DIAGNOSIS — E1165 Type 2 diabetes mellitus with hyperglycemia: Secondary | ICD-10-CM

## 2016-12-08 DIAGNOSIS — E118 Type 2 diabetes mellitus with unspecified complications: Secondary | ICD-10-CM | POA: Diagnosis not present

## 2016-12-08 DIAGNOSIS — IMO0002 Reserved for concepts with insufficient information to code with codable children: Secondary | ICD-10-CM

## 2016-12-08 DIAGNOSIS — E669 Obesity, unspecified: Secondary | ICD-10-CM | POA: Diagnosis not present

## 2016-12-08 NOTE — Progress Notes (Signed)
PATIENT: Natalie Snyder DOB: December 09, 1965  Chief Complaint  Patient presents with  . Cerebrovascular Accident    She is here with her son, Clifton Custard.  Reports having two strokes in 05/2016 and another stroke in 08/2016.  She still has mild right-sided weakness and numbness.  She is taking Plavix 75mg  and aspirin 81mg  daily.  Marland Kitchen PCP    Richardean Chimera, MD - referred by ED     HISTORICAL  Natalie DEEG is a 51 years old right-handed female, seen in refer by Dr. Richardean Chimera to follow-up on stroke, initial evaluation on December 08 2016  She had a history of hypertension, hyperlipidemia, was diagnosed with diabetes since August 2017,   She was started on aspirin 81 mg, plavix since her initial stroke in August 2017, but could not tolerate aspirin due to GI side effect, she stopped taking it, May 22 2016, she presented with right side numbness,  MRI of the brain showed left thalamic stroke. She had a persistent right paresthesia since then.  She was admitted to hospital on September 21 2016 for acute onset left facial droop, left-sided numbness,   Echocardiogram showed no significant abnormality, Laboratory evaluation showed poorly controlled diabetes, A1c was 9.9 upon admission I reviewed extensive laboratory evaluations since August 2017, hemoglobin mildly elevated 16.4, CMP showed elevated glucose 252, low sodium 133, negative UDS, mild elevated protein C 196, total protein C was normal, normal protein S, lupus anticoagulant, beta 2 glycoprotein, homocystine, factor V Leyden, cardiolipin antibody, A1c was 9.9 on May 23 2016, LDL was 1:30, cholesterol was 224, triglycerides right was 283, repeat A1c was 9.4 on September 21 2016, LDL was 36, elevated triglycerides right 332. Positive ANA,  I personally reviewed MRI of brain without contrast on September 21 2016, acute infarction in the right posterior-inferior pounds,  involution of previous stroke at left basal ganglia/thalamus, mild atrophy,  likely small vessel coronary disease.   CT angiogram of head neck showed no large vessel disease.  REVIEW OF SYSTEMS: Full 14 system review of systems performed and notable only forallergy, easy bruising, fatigue, shortness of breath   ALLERGIES: Allergies  Allergen Reactions  . Latex Rash    HOME MEDICATIONS: Current Outpatient Prescriptions  Medication Sig Dispense Refill  . amLODipine (NORVASC) 10 MG tablet Take 10 mg by mouth daily.     Marland Kitchen aspirin EC 81 MG EC tablet Take 1 tablet (81 mg total) by mouth daily. 30 tablet 0  . atorvastatin (LIPITOR) 40 MG tablet Take 1 tablet (40 mg total) by mouth daily at 6 PM. 30 tablet 1  . cholecalciferol (VITAMIN D) 1000 units tablet Take 1,000 Units by mouth daily.    . clopidogrel (PLAVIX) 75 MG tablet Take 1 tablet (75 mg total) by mouth daily. (Patient taking differently: Take 75 mg by mouth every evening. ) 30 tablet 2  . escitalopram (LEXAPRO) 10 MG tablet Take 10 mg by mouth daily.     Marland Kitchen HUMALOG KWIKPEN 100 UNIT/ML KiwkPen As directed    . lisinopril-hydrochlorothiazide (PRINZIDE,ZESTORETIC) 20-12.5 MG tablet Take two tablets once daily.    . metFORMIN (GLUCOPHAGE-XR) 500 MG 24 hr tablet Take 500 mg by mouth 4 (four) times daily.     . metoprolol succinate (TOPROL-XL) 50 MG 24 hr tablet Take 50 mg by mouth daily.     . pantoprazole (PROTONIX) 40 MG tablet Take 1 tablet (40 mg total) by mouth daily. 30 tablet 0  . TRESIBA FLEXTOUCH 200 UNIT/ML  SOPN As directed     No current facility-administered medications for this visit.     PAST MEDICAL HISTORY: Past Medical History:  Diagnosis Date  . Depression   . Diabetes mellitus without complication (HCC)   . Herpes   . Hypertension   . Stroke [facility](HCC) 05/2016    PAST SURGICAL HISTORY: Past Surgical History:  Procedure Laterality Date  . CESAREAN SECTION      FAMILY HISTORY: Family History  Problem Relation Age of Onset  . Pulmonary embolism Mother 3672  . Aneurysm Mother   . CAD  Father     5 MIs, first while in 4570s  . Parkinson's disease Father   . Hypertension Father   . Stroke Maternal Grandmother     SOCIAL HISTORY:  Social History   Social History  . Marital status: Married    Spouse name: N/A  . Number of children: 1  . Years of education: 12th grade   Occupational History  . unemployed - CNA    Social History Main Topics  . Smoking status: Former Games developermoker  . Smokeless tobacco: Never Used     Comment: Quit 1996  . Alcohol use No  . Drug use: No  . Sexual activity: Not on file   Other Topics Concern  . Not on file   Social History Narrative   Lives at home with husband.   Right-handed.   No caffeine use.     PHYSICAL EXAM   Vitals:   12/08/16 1344  BP: 140/88  Pulse: 90  Weight: 296 lb (134.3 kg)  Height: 5\' 5"  (1.651 m)    Not recorded      Body mass index is 49.26 kg/m.  PHYSICAL EXAMNIATION:  Gen: NAD, conversant, well nourised, obese, well groomed                     Cardiovascular: Regular rate rhythm, no peripheral edema, warm, nontender. Eyes: Conjunctivae clear without exudates or hemorrhage Neck: Supple, no carotid bruits. Pulmonary: Clear to auscultation bilaterally   NEUROLOGICAL EXAM:  MENTAL STATUS: Speech:    Speech is normal; fluent and spontaneous with normal comprehension.  Cognition:     Orientation to time, place and person     Normal recent and remote memory     Normal Attention span and concentration     Normal Language, naming, repeating,spontaneous speech     Fund of knowledge   CRANIAL NERVES: CN II: Visual fields are full to confrontation. Fundoscopic exam is normal with sharp discs and no vascular changes. Pupils are round equal and briskly reactive to light. CN III, IV, VI: extraocular movement are normal. No ptosis. CN V: Facial sensation is intact to pinprick in all 3 divisions bilaterally. Corneal responses are intact.  CN VII: Face is symmetric with normal eye closure and  smile. CN VIII: Hearing is normal to rubbing fingers CN IX, X: Palate elevates symmetrically. Phonation is normal. CN XI: Head turning and shoulder shrug are intact CN XII: Tongue is midline with normal movements and no atrophy.  MOTOR: There is no pronator drift of out-stretched arms. Muscle bulk and tone are normal. Muscle strength is normal.  REFLEXES: Reflexes are 2+ and symmetric at the biceps, triceps, knees, and ankles. Plantar responses are flexor.  SENSORY: Intact to light touch, pinprick, positional sensation and vibratory sensation are intact in fingers and toes.  COORDINATION: Rapid alternating movements and fine finger movements are intact. There is no dysmetria on finger-to-nose and heel-knee-shin.  GAIT/STANCE: Posture is normal. Gait is steady with normal steps, base, arm swing, and turning. Heel and toe walking are normal. Tandem gait is normal.  Romberg is absent.   DIAGNOSTIC DATA (LABS, IMAGING, TESTING) - I reviewed patient records, labs, notes, testing and imaging myself where available.   ASSESSMENT AND PLAN  FAYETTA SORENSON is a 51 y.o. female   Stroke  Small vessel disease, she has poorly controlled diabetes, also has Vascular risk factor of hypertension hyperlipidemia   continue dual therapy aspirin 81 mg plus Plavix 75 mg  Tight control of diabetes goal of A1c less than 7, LDL less than 70, blood pressure less than 130/80  Levert Feinstein, M.D. Ph.D.  [facility] [address], [address] [place], [place] [address] Ph: [phone] Fax: [phone]  CC: Richardean Chimera, MD

## 2017-06-15 ENCOUNTER — Ambulatory Visit: Payer: Managed Care, Other (non HMO) | Admitting: Neurology

## 2017-08-01 IMAGING — MR MR HEAD W/O CM
8 of 10 series · 35 of 48 positions shown · non-contrast
Comparison: CT same day

CLINICAL DATA: Numbness of the right side beginning 3 days ago.

EXAM:
MRI HEAD WITHOUT CONTRAST
MRA HEAD WITHOUT CONTRAST
TECHNIQUE: Multiplanar, multiecho pulse sequences of the brain and surrounding
structures were obtained without intravenous contrast. Angiographic
images of the head were obtained using MRA technique without
contrast.

[Series 2: t1_fl2d_sag · sagittal · 5.0mm · 0.39mm/px · 1 of 20 slices shown]
[im 1/20]
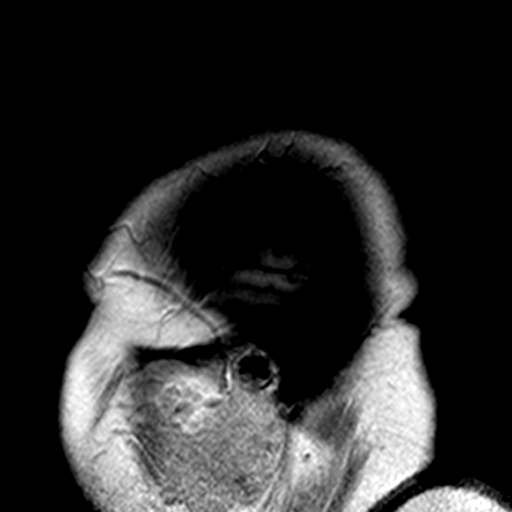

[Series 5: T2 · axial · 5.0mm · 0.63mm/px · z∈[-56,+87]mm · 3 of 23 slices shown (1 of 2)]
[im 1/23]
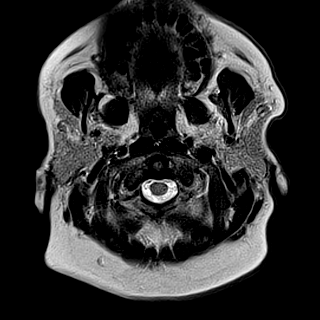
[im 12/23]
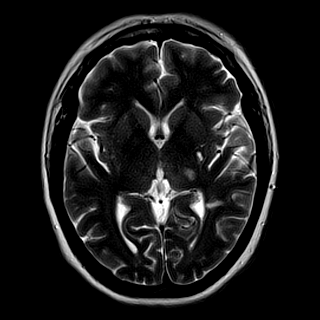
[im 23/23]
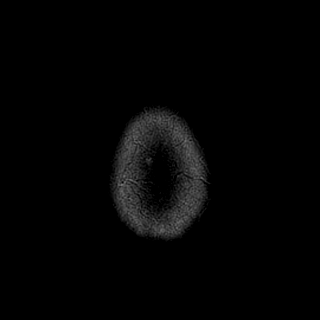

[Series 6: FLAIR · axial · 5.0mm · 0.81mm/px · z∈[-56,+87]mm · 3 of 23 slices shown]
[im 1/23]
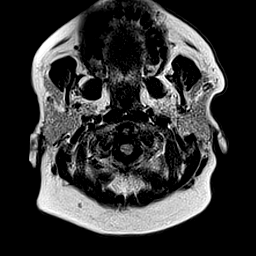
[im 12/23]
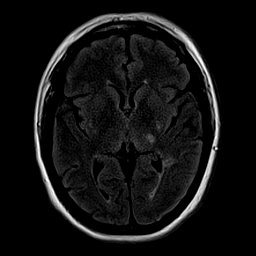
[im 23/23]
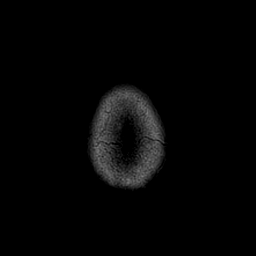

[Series 7: T1 · axial · 2.0mm · 0.41mm/px · z∈[-62,+88]mm · 9 of 76 slices shown]
[im 1/76]
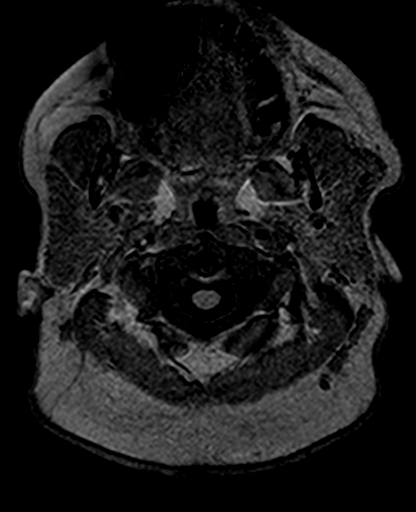
[im 10/76]
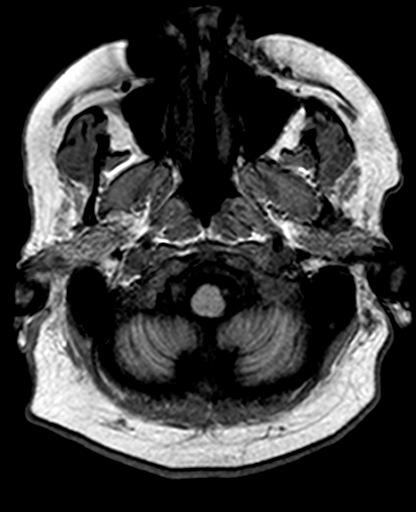
[im 19/76]
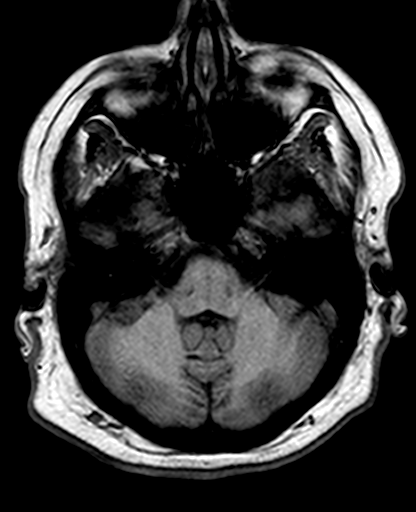
[im 29/76]
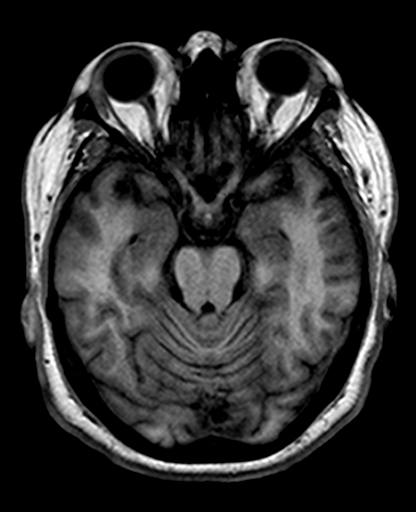
[im 38/76]
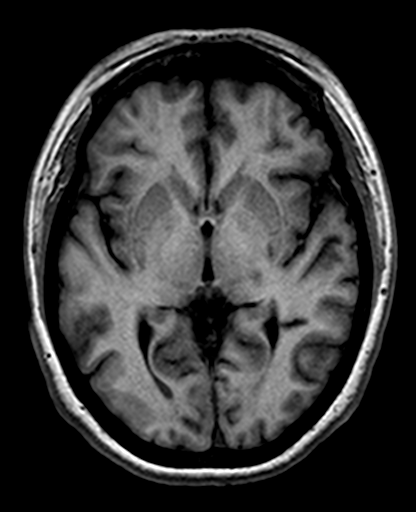
[im 47/76]
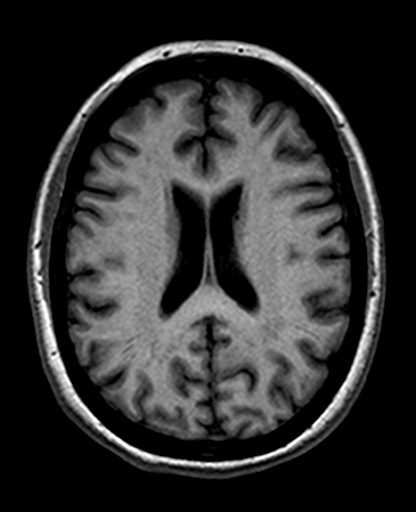
[im 57/76]
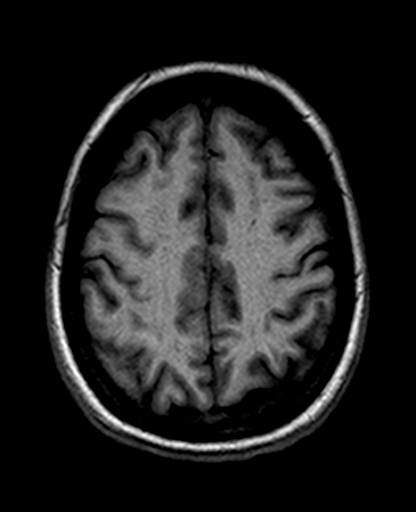
[im 66/76]
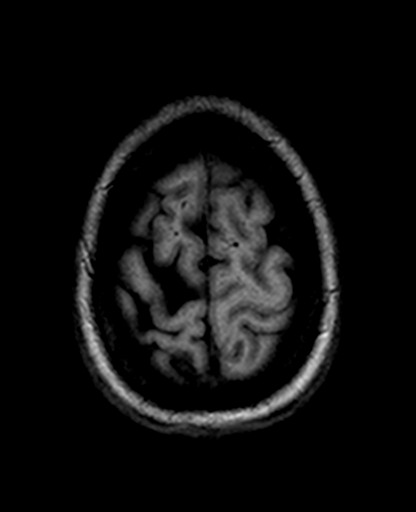
[im 76/76]
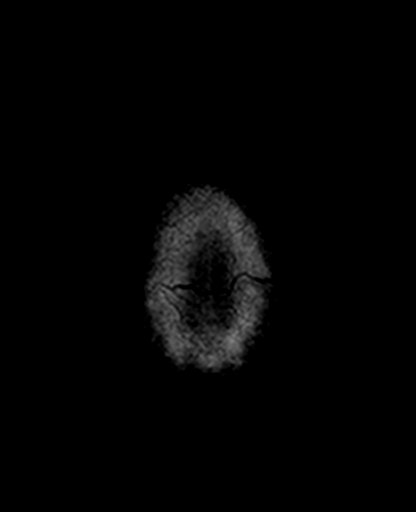

[Series 8: trauma axial · axial · 5.0mm · 0.40mm/px · z∈[-49,+81]mm · 3 of 21 slices shown]
[im 1/21]
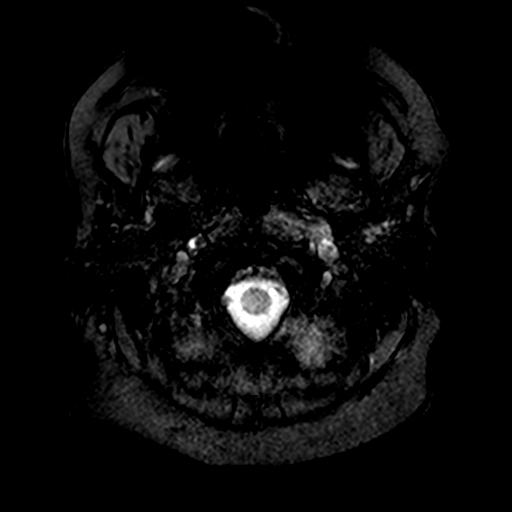
[im 11/21]
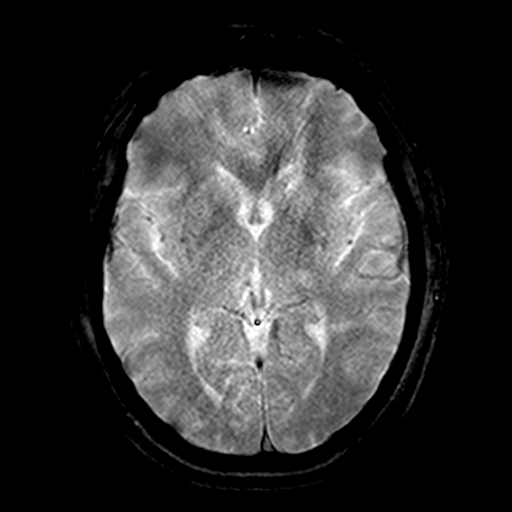
[im 21/21]
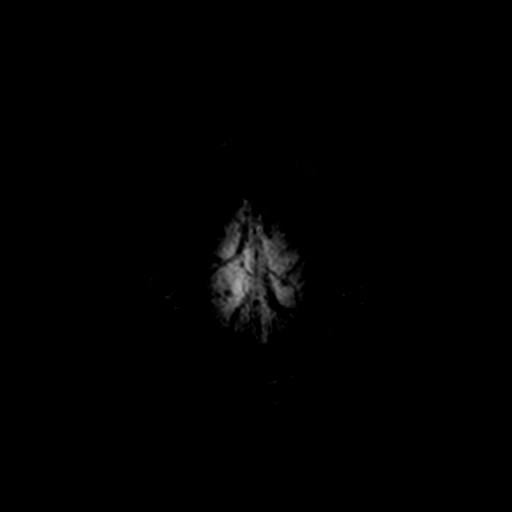

[Series 9: T2 · coronal · 5.0mm · 0.58mm/px · 3 of 28 slices shown (2 of 2)]
[im 1/28]
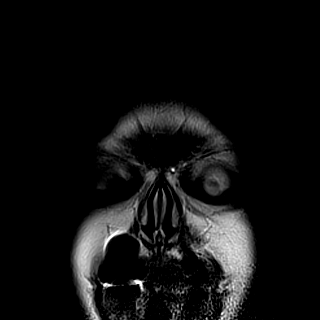
[im 14/28]
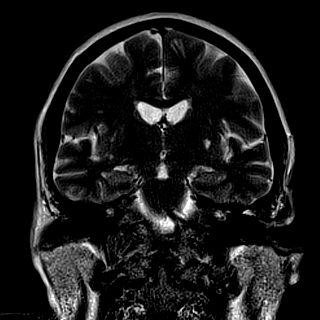
[im 28/28]
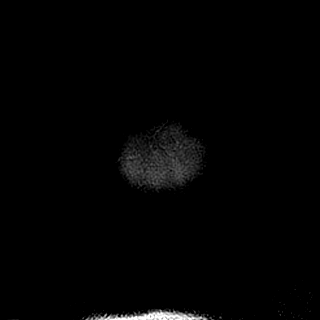

[Series 100: <mpr thick range> · axial · 3.0mm · 0.71mm/px · z∈[-51,+85]mm · 6 of 46 slices shown]
[im 1/46]
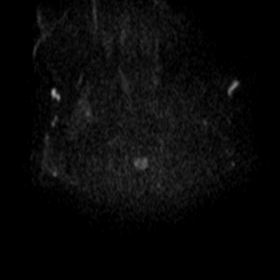
[im 10/46]
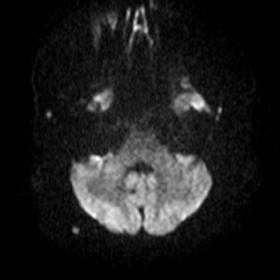
[im 19/46]
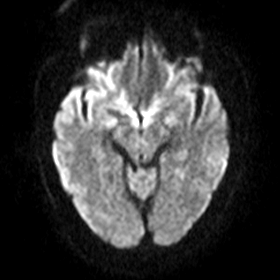
[im 28/46]
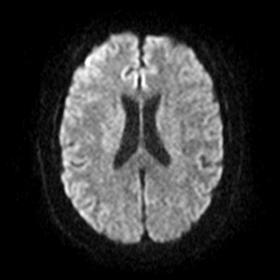
[im 37/46]
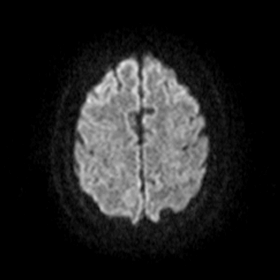
[im 46/46]
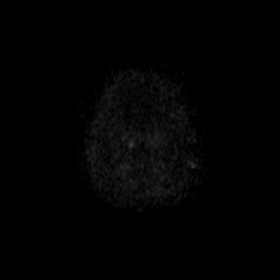

[Series 101: <mpr thick range(1)> · coronal · 3.0mm · 0.62mm/px · 7 of 55 slices shown]
[im 1/55]
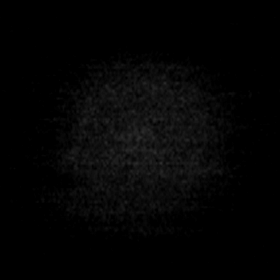
[im 10/55]
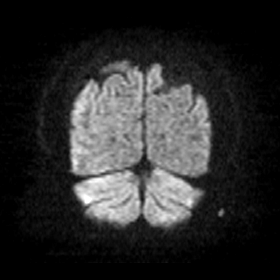
[im 19/55]
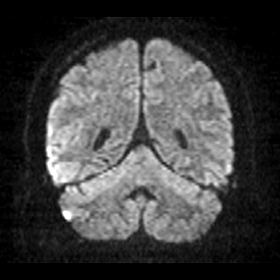
[im 28/55]
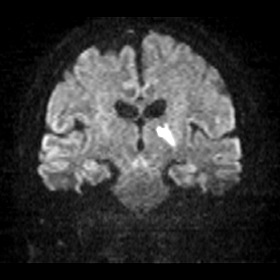
[im 37/55]
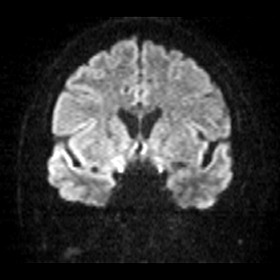
[im 46/55]
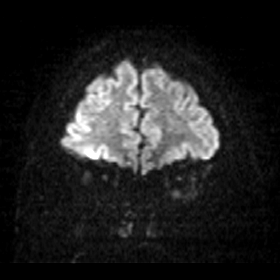
[im 55/55]
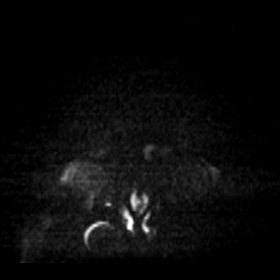

[35 of 48 positions shown; findings below may reference images not displayed]

FINDINGS: MRI HEAD FINDINGS

Diffusion imaging shows acute infarction affecting the lateral left
thalamus. No other acute infarction.

The brainstem and cerebellum are normal. Cerebral hemispheres
otherwise show an old infarction in the white matter adjacent to the
atrium of the left lateral ventricle in an old lacunar infarction in
the left basal ganglia. Mild chronic small-vessel ischemic changes
seen elsewhere within the hemispheric white matter. There is a
prominent CSF space at the vertex on the right favored to represent
an arachnoid cyst rather than an old cortical infarction. No
evidence of mass lesion, hemorrhage, hydrocephalus or extra-axial
collection. No pituitary mass. No inflammatory sinus disease. No
skull or skullbase lesion.

MRA HEAD FINDINGS

Both internal carotid arteries are widely patent into the brain. The
anterior and middle cerebral vessels are patent proximally. There is
50% stenosis of the left M2 segment. There is narrowing and
irregularity in M2 branches bilaterally. Distal anterior cerebral
branches show irregularity and narrowing.

Both vertebral arteries are widely patent to the basilar. No basilar
stenosis. Narrowing and irregularity noted in the more distal branch
vessels affecting the right posterior inferior cerebellar artery,
left anterior inferior cerebellar artery and both posterior cerebral
arteries.
IMPRESSION: Acute infarction left lateral thalamus.

Old lacunar infarction left hemispheric deep white matter adjacent
to the atrium of the left lateral ventricle and left basal ganglia.

No large vessel occlusion or correctable proximal intracranial
stenosis. Considerable atherosclerotic narrowing and irregularity of
the medium size intracranial branches diffusely.

## 2017-12-06 DIAGNOSIS — E1165 Type 2 diabetes mellitus with hyperglycemia: Secondary | ICD-10-CM | POA: Diagnosis not present

## 2017-12-06 DIAGNOSIS — I10 Essential (primary) hypertension: Secondary | ICD-10-CM | POA: Diagnosis not present

## 2017-12-06 DIAGNOSIS — E041 Nontoxic single thyroid nodule: Secondary | ICD-10-CM | POA: Diagnosis not present

## 2017-12-06 DIAGNOSIS — E782 Mixed hyperlipidemia: Secondary | ICD-10-CM | POA: Diagnosis not present

## 2017-12-08 DIAGNOSIS — E1165 Type 2 diabetes mellitus with hyperglycemia: Secondary | ICD-10-CM | POA: Diagnosis not present

## 2017-12-08 DIAGNOSIS — I63312 Cerebral infarction due to thrombosis of left middle cerebral artery: Secondary | ICD-10-CM | POA: Diagnosis not present

## 2017-12-08 DIAGNOSIS — E782 Mixed hyperlipidemia: Secondary | ICD-10-CM | POA: Diagnosis not present

## 2017-12-08 DIAGNOSIS — I10 Essential (primary) hypertension: Secondary | ICD-10-CM | POA: Diagnosis not present

## 2017-12-08 DIAGNOSIS — Z1389 Encounter for screening for other disorder: Secondary | ICD-10-CM | POA: Diagnosis not present

## 2018-01-19 DIAGNOSIS — E041 Nontoxic single thyroid nodule: Secondary | ICD-10-CM | POA: Diagnosis not present

## 2018-03-03 DIAGNOSIS — E041 Nontoxic single thyroid nodule: Secondary | ICD-10-CM | POA: Diagnosis not present

## 2018-04-07 DIAGNOSIS — E041 Nontoxic single thyroid nodule: Secondary | ICD-10-CM | POA: Diagnosis not present

## 2018-04-07 DIAGNOSIS — E1142 Type 2 diabetes mellitus with diabetic polyneuropathy: Secondary | ICD-10-CM | POA: Diagnosis not present

## 2018-04-07 DIAGNOSIS — G6289 Other specified polyneuropathies: Secondary | ICD-10-CM | POA: Diagnosis not present

## 2018-04-07 DIAGNOSIS — E1165 Type 2 diabetes mellitus with hyperglycemia: Secondary | ICD-10-CM | POA: Diagnosis not present

## 2018-04-07 DIAGNOSIS — E782 Mixed hyperlipidemia: Secondary | ICD-10-CM | POA: Diagnosis not present

## 2018-04-07 DIAGNOSIS — Z9189 Other specified personal risk factors, not elsewhere classified: Secondary | ICD-10-CM | POA: Diagnosis not present

## 2018-04-07 DIAGNOSIS — I10 Essential (primary) hypertension: Secondary | ICD-10-CM | POA: Diagnosis not present

## 2018-04-11 DIAGNOSIS — E1165 Type 2 diabetes mellitus with hyperglycemia: Secondary | ICD-10-CM | POA: Diagnosis not present

## 2018-04-11 DIAGNOSIS — E782 Mixed hyperlipidemia: Secondary | ICD-10-CM | POA: Diagnosis not present

## 2018-04-11 DIAGNOSIS — I63312 Cerebral infarction due to thrombosis of left middle cerebral artery: Secondary | ICD-10-CM | POA: Diagnosis not present

## 2018-04-11 DIAGNOSIS — Z23 Encounter for immunization: Secondary | ICD-10-CM | POA: Diagnosis not present

## 2018-04-11 DIAGNOSIS — I10 Essential (primary) hypertension: Secondary | ICD-10-CM | POA: Diagnosis not present

## 2018-06-24 ENCOUNTER — Encounter (HOSPITAL_COMMUNITY): Payer: Self-pay | Admitting: Physical Therapy

## 2018-06-24 NOTE — Therapy (Signed)
[facility] [place], [place], [address] Phone: [phone]   Fax:  [phone]  Patient Details  Name: Natalie Snyder MRN: [id number] Date of Birth: Mar 16, 1966 Referring Provider:  No ref. provider found  Encounter Date: 06/24/2018 PHYSICAL THERAPY DISCHARGE SUMMARY  Visits from Start of Care: 5  Current functional level related to goals / functional outcomes: Unknown as pt did not return   Remaining deficits: Unknown    Education / Equipment: HEP  Plan: Patient agrees to discharge.  Patient goals were partially met. Patient is being discharged due to not returning since the last visit.  ?????       Rayetta Humphrey, PT CLT [phone] 06/24/2018, 2:16 PM  [facility] [address] [place], [place], [address] Phone: [phone]   Fax:  [phone]

## 2018-08-04 DIAGNOSIS — I10 Essential (primary) hypertension: Secondary | ICD-10-CM | POA: Diagnosis not present

## 2018-08-04 DIAGNOSIS — E782 Mixed hyperlipidemia: Secondary | ICD-10-CM | POA: Diagnosis not present

## 2018-08-04 DIAGNOSIS — Z9189 Other specified personal risk factors, not elsewhere classified: Secondary | ICD-10-CM | POA: Diagnosis not present

## 2018-08-04 DIAGNOSIS — R42 Dizziness and giddiness: Secondary | ICD-10-CM | POA: Diagnosis not present

## 2018-08-04 DIAGNOSIS — E1165 Type 2 diabetes mellitus with hyperglycemia: Secondary | ICD-10-CM | POA: Diagnosis not present

## 2018-08-09 DIAGNOSIS — E1165 Type 2 diabetes mellitus with hyperglycemia: Secondary | ICD-10-CM | POA: Diagnosis not present

## 2018-08-09 DIAGNOSIS — I10 Essential (primary) hypertension: Secondary | ICD-10-CM | POA: Diagnosis not present

## 2018-08-09 DIAGNOSIS — Z23 Encounter for immunization: Secondary | ICD-10-CM | POA: Diagnosis not present

## 2018-08-09 DIAGNOSIS — E782 Mixed hyperlipidemia: Secondary | ICD-10-CM | POA: Diagnosis not present

## 2018-08-09 DIAGNOSIS — Z1212 Encounter for screening for malignant neoplasm of rectum: Secondary | ICD-10-CM | POA: Diagnosis not present

## 2018-08-12 DIAGNOSIS — M79643 Pain in unspecified hand: Secondary | ICD-10-CM | POA: Diagnosis not present

## 2018-08-12 DIAGNOSIS — G4733 Obstructive sleep apnea (adult) (pediatric): Secondary | ICD-10-CM | POA: Diagnosis not present

## 2018-08-12 DIAGNOSIS — R5383 Other fatigue: Secondary | ICD-10-CM | POA: Diagnosis not present

## 2018-08-12 DIAGNOSIS — G5603 Carpal tunnel syndrome, bilateral upper limbs: Secondary | ICD-10-CM | POA: Diagnosis not present

## 2018-08-17 ENCOUNTER — Other Ambulatory Visit (HOSPITAL_BASED_OUTPATIENT_CLINIC_OR_DEPARTMENT_OTHER): Payer: Self-pay

## 2018-08-17 DIAGNOSIS — G4730 Sleep apnea, unspecified: Secondary | ICD-10-CM

## 2018-08-17 DIAGNOSIS — R0683 Snoring: Secondary | ICD-10-CM

## 2018-09-20 DIAGNOSIS — R5383 Other fatigue: Secondary | ICD-10-CM | POA: Diagnosis not present

## 2018-09-21 ENCOUNTER — Encounter (HOSPITAL_BASED_OUTPATIENT_CLINIC_OR_DEPARTMENT_OTHER): Payer: 59

## 2018-09-22 DIAGNOSIS — G5602 Carpal tunnel syndrome, left upper limb: Secondary | ICD-10-CM | POA: Diagnosis not present

## 2018-09-22 DIAGNOSIS — G5601 Carpal tunnel syndrome, right upper limb: Secondary | ICD-10-CM | POA: Diagnosis not present

## 2018-09-22 DIAGNOSIS — G608 Other hereditary and idiopathic neuropathies: Secondary | ICD-10-CM | POA: Diagnosis not present

## 2018-09-27 DIAGNOSIS — F39 Unspecified mood [affective] disorder: Secondary | ICD-10-CM | POA: Diagnosis not present

## 2018-09-27 DIAGNOSIS — F10180 Alcohol abuse with alcohol-induced anxiety disorder: Secondary | ICD-10-CM | POA: Diagnosis not present

## 2018-10-06 DIAGNOSIS — M25531 Pain in right wrist: Secondary | ICD-10-CM | POA: Diagnosis not present

## 2018-10-06 DIAGNOSIS — M25532 Pain in left wrist: Secondary | ICD-10-CM | POA: Diagnosis not present

## 2018-10-13 DIAGNOSIS — F39 Unspecified mood [affective] disorder: Secondary | ICD-10-CM | POA: Diagnosis not present

## 2018-10-13 DIAGNOSIS — F10180 Alcohol abuse with alcohol-induced anxiety disorder: Secondary | ICD-10-CM | POA: Diagnosis not present

## 2018-10-24 DIAGNOSIS — G5603 Carpal tunnel syndrome, bilateral upper limbs: Secondary | ICD-10-CM | POA: Diagnosis not present

## 2018-11-04 ENCOUNTER — Other Ambulatory Visit: Payer: Self-pay

## 2018-11-04 ENCOUNTER — Encounter (HOSPITAL_COMMUNITY)
Admission: RE | Admit: 2018-11-04 | Discharge: 2018-11-04 | Disposition: A | Discharge status: Home / Self Care | Payer: BLUE CROSS/BLUE SHIELD | Source: Ambulatory Visit | Attending: Orthopedic Surgery | Admitting: Orthopedic Surgery

## 2018-11-04 ENCOUNTER — Encounter (HOSPITAL_COMMUNITY): Payer: Self-pay

## 2018-11-04 DIAGNOSIS — Z01818 Encounter for other preprocedural examination: Secondary | ICD-10-CM | POA: Insufficient documentation

## 2018-11-04 LAB — CBC
HCT: 38.8 % (ref 36.0–46.0)
Hemoglobin: 12.7 g/dL (ref 12.0–15.0)
MCH: 29.7 pg (ref 26.0–34.0)
MCHC: 32.7 g/dL (ref 30.0–36.0)
MCV: 90.7 fL (ref 80.0–100.0)
Platelets: 302 10*3/uL (ref 150–400)
RBC: 4.28 MIL/uL (ref 3.87–5.11)
RDW: 14.8 % (ref 11.5–15.5)
WBC: 10.3 10*3/uL (ref 4.0–10.5)
nRBC: 0 % (ref 0.0–0.2)

## 2018-11-04 LAB — BASIC METABOLIC PANEL
Anion gap: 13 (ref 5–15)
BUN: 13 mg/dL (ref 6–20)
CO2: 24 mmol/L (ref 22–32)
Calcium: 9.1 mg/dL (ref 8.9–10.3)
Chloride: 100 mmol/L (ref 98–111)
Creatinine, Ser: 0.8 mg/dL (ref 0.44–1.00)
GFR calc Af Amer: >60 mL/min (ref >60)
Glucose, Bld: 85 mg/dL (ref 70–99)
Potassium: 3.8 mmol/L (ref 3.5–5.1)
Sodium: 137 mmol/L (ref 135–145)

## 2018-11-04 LAB — GLUCOSE, CAPILLARY
GLUCOSE-CAPILLARY: 64 mg/dL — AB (ref 70–99)
Glucose-Capillary: 79 mg/dL (ref 70–99)

## 2018-11-04 LAB — HEMOGLOBIN A1C
Hgb A1c MFr Bld: 5.8 % — ABNORMAL HIGH (ref 4.8–5.6)
MEAN PLASMA GLUCOSE: 119.76 mg/dL

## 2018-11-04 NOTE — Patient Instructions (Addendum)
Natalie Snyder  11/04/2018   Your procedure is scheduled on: 11-11-18    Report to [facility] Main  Entrance    Report to Admitting at 8:18 AM    Call this number if you have problems the morning of surgery [phone]    Remember: Do not eat food or drink liquids :After Midnight. BRUSH YOUR TEETH MORNING OF SURGERY AND RINSE YOUR MOUTH OUT, NO CHEWING GUM CANDY OR MINTS.     Take these medicines the morning of surgery with A SIP OF WATER: Amlodipine (Norvasc), Escitalopram (Lexapro), Gabapentin (Neurontin), Levothyroxine (Synthroid), and Pantoprazole (Protonix)   DO NOT TAKE ANY DIABETIC MEDICATIONS DAY OF YOUR SURGERY                               You may not have any metal on your body including hair pins and              piercings  Do not wear jewelry, make-up, lotions, powders or perfumes, deodorant             Do not wear nail polish.  Do not shave  48 hours prior to surgery.                 Do not bring valuables to the hospital. [facility] IS NOT             RESPONSIBLE   FOR VALUABLES.  Contacts, dentures or bridgework may not be worn into surgery.  Leave suitcase in the car. After surgery it may be brought to your room.     Patients discharged the day of surgery will not be allowed to drive home. IF YOU ARE HAVING SURGERY AND GOING HOME THE SAME DAY, YOU MUST HAVE AN ADULT TO DRIVE YOU HOME AND BE WITH YOU FOR 24 HOURS. YOU MAY GO HOME BY TAXI OR UBER OR ORTHERWISE, BUT AN ADULT MUST ACCOMPANY YOU HOME AND STAY WITH YOU FOR 24 HOURS.    Special Instructions: N/A              Please read over the following fact sheets you were given: _____________________________________________________________________ How to Manage Your Diabetes Before and After Surgery  Why is it important to control my blood sugar before and after surgery? . Improving blood sugar levels before and after surgery helps healing and can limit problems. . A way of improving  blood sugar control is eating a healthy diet by: o  Eating less sugar and carbohydrates o  Increasing activity/exercise o  Talking with your doctor about reaching your blood sugar goals . High blood sugars (greater than 180 mg/dL) can raise your risk of infections and slow your recovery, so you will need to focus on controlling your diabetes during the weeks before surgery. . Make sure that the doctor who takes care of your diabetes knows about your planned surgery including the date and location.  How do I manage my blood sugar before surgery? . Check your blood sugar at least 4 times a day, starting 2 days before surgery, to make sure that the level is not too high or low. o Check your blood sugar the morning of your surgery when you wake up and every 2 hours until you get to the Short Stay unit. . If your blood sugar is less than 70 mg/dL, you  will need to treat for low blood sugar: o Do not take insulin. o Treat a low blood sugar (less than 70 mg/dL) with  cup of clear juice (cranberry or apple), 4 glucose tablets, OR glucose gel. o Recheck blood sugar in 15 minutes after treatment (to make sure it is greater than 70 mg/dL). If your blood sugar is not greater than 70 mg/dL on recheck, call [phone] for further instructions. . Report your blood sugar to the short stay nurse when you get to Short Stay.  . If you are admitted to the hospital after surgery: o Your blood sugar will be checked by the staff and you will probably be given insulin after surgery (instead of oral diabetes medicines) to make sure you have good blood sugar levels. o The goal for blood sugar control after surgery is 80-180 mg/dL.   WHAT DO I DO ABOUT MY DIABETES MEDICATION?  Marland Kitchen. Do not take oral diabetes medicines (pills) the morning of surgery.  . THE DAY BEFORE SURGERY, take your usual dose of Metformin. Take 10-60  units of your Novolin 70/30 AM dose of insulin. And only take 7-42 U or 70% of your bedtime dose.       . The day of surgery, do not take other diabetes injectables, including Byetta (exenatide), Bydureon (exenatide ER), Victoza (liraglutide), or Trulicity (dulaglutide).  . If your CBG is greater than 220 mg/dL, you may take  of your sliding scale  . (correction) dose of insulin.      Reviewed and Endorsed by [facility] Patient Education Committee, August 2015            [facility] - Preparing for [facility] Before surgery, you can play an important role.  Because skin is not sterile, your skin needs to be as free of germs as possible.  You can reduce the number of germs on your skin by washing with CHG (chlorahexidine gluconate) soap before surgery.  CHG is an antiseptic cleaner which kills germs and bonds with the skin to continue killing germs even after washing. Please DO NOT use if you have an allergy to CHG or antibacterial soaps.  If your skin becomes reddened/irritated stop using the CHG and inform your nurse when you arrive at Short Stay. Do not shave (including legs and underarms) for at least 48 hours prior to the first CHG shower.  You may shave your face/neck. Please follow these instructions carefully:  1.  Shower with CHG Soap the night before surgery and the  morning of Surgery.  2.  If you choose to wash your hair, wash your hair first as usual with your  normal  shampoo.  3.  After you shampoo, rinse your hair and body thoroughly to remove the  shampoo.                           4.  Use CHG as you would any other liquid soap.  You can apply chg directly  to the skin and wash                       Gently with a scrungie or clean washcloth.  5.  Apply the CHG Soap to your body ONLY FROM THE NECK DOWN.   Do not use on face/ open  Wound or open sores. Avoid contact with eyes, ears mouth and genitals (private parts).                       Wash face,  Genitals (private parts) with your normal soap.             6.  Wash thoroughly, paying special  attention to the area where your surgery  will be performed.  7.  Thoroughly rinse your body with warm water from the neck down.  8.  DO NOT shower/wash with your normal soap after using and rinsing off  the CHG Soap.                9.  Pat yourself dry with a clean towel.            10.  Wear clean pajamas.            11.  Place clean sheets on your bed the night of your first shower and do not  sleep with pets. Day of Surgery : Do not apply any lotions/deodorants the morning of surgery.  Please wear clean clothes to the hospital/surgery center.  FAILURE TO FOLLOW THESE INSTRUCTIONS MAY RESULT IN THE CANCELLATION OF YOUR SURGERY PATIENT SIGNATURE_________________________________  NURSE SIGNATURE__________________________________  ________________________________________________________________________

## 2018-11-07 ENCOUNTER — Ambulatory Visit: Payer: Self-pay | Admitting: Physician Assistant

## 2018-11-07 NOTE — Progress Notes (Signed)
   11/04/18 1423  OBSTRUCTIVE SLEEP APNEA  Have you ever been diagnosed with sleep apnea through a sleep study? No  Do you snore loudly (loud enough to be heard through closed doors)?  1  Do you often feel tired, fatigued, or sleepy during the daytime (such as falling asleep during driving or talking to someone)? 0  Has anyone observed you stop breathing during your sleep? 0  Do you have, or are you being treated for high blood pressure? 1  BMI more than 35 kg/m2? 1  Age > 50 (1-yes) 1  Neck circumference greater than:Female 16 inches or larger, Female 17inches or larger? 1  Female Gender (Yes=1) 0  Obstructive Sleep Apnea Score 5

## 2018-11-07 NOTE — H&P (View-Only) (Signed)
Natalie Snyder is an 53 y.o. female.   Chief Complaint: bilat wrist pain HPI: The patient has a BMI of 50 and meets the AMA guidelines for morbid (severe) obesity with a BMI>40.0, and I have recommended weight loss.  She has had the insidious onset of pain.  She has nerve conduction showing severe carpal tunnel, double crush syndrome with history of diabetes.  Past history:  The patient has a history of HTN and DM with a history of stroke and was hospitalized in 2017, on aspirin and Plavix.  Other medicines are dictated in the record.  She is on melatonin and has been on gabapentin, metformin, pantoprazole, HCTZ, atorvastatin, amlodipine, and levothyroxine.   Past Medical History:  Diagnosis Date  . Depression   . Diabetes mellitus without complication (HCC)   . Herpes   . Hypertension   . Stroke [facility](HCC) 05/2016    Past Surgical History:  Procedure Laterality Date  . CESAREAN SECTION      Family History  Problem Relation Age of Onset  . Pulmonary embolism Mother 3272  . Aneurysm Mother   . CAD Father        5 MIs, first while in 6070s  . Parkinson's disease Father   . Hypertension Father   . Stroke Maternal Grandmother    Social History:  reports that she has quit smoking. Her smoking use included cigarettes. She has a 10.00 pack-year smoking history. She has never used smokeless tobacco. She reports that she does not drink alcohol or use drugs.  Allergies:  Allergies  Allergen Reactions  . Latex Rash    (Not in a hospital admission)   No results found for this or any previous visit (from the past 48 hour(s)). No results found.  Review of Systems  Musculoskeletal: Positive for joint pain.  Neurological: Positive for tingling.  Psychiatric/Behavioral: Positive for depression. The patient is nervous/anxious.   All other systems reviewed and are negative.   There were no vitals taken for this visit. Physical Exam  Constitutional: She is oriented to person, place, and time.  She appears well-developed and well-nourished. No distress.  HENT:  Head: Normocephalic and atraumatic.  Eyes: Pupils are equal, round, and reactive to light. Conjunctivae and EOM are normal.  Neck: Normal range of motion. Neck supple.  Cardiovascular: Normal rate and intact distal pulses.  Respiratory: Effort normal. No respiratory distress.  GI: Soft. She exhibits no distension.  Musculoskeletal:     Comments: Positive tinels R wrist   Neurological: She is alert and oriented to person, place, and time.  Skin: Skin is warm and dry. No rash noted. No erythema.  Psychiatric: She has a normal mood and affect. Her behavior is normal.     Assessment/Plan Right Carpal Tunnel Syndrome  1.  She is definitely a candidate for surgery.  She is young.  The risks and benefits have been discussed in detail.  Will need obviously the potential to go off both aspirin and Plavix, certainly off Plavix and preferably off both.  I think with the history of stroke we could probably do that, but will check with the medical team. 2.  Choice anesthetic.  We will probably lean towards nothing on a general level due to BMI, and based on the hospital rule we will need to do this in the hospital, although it is not clear if she would have to stay overnight.   3.  I do think based on these symptoms that she is a candidate for  surgery, and, again, the risks and benefits have been discussed in detail with the patient.  Proceed onward with scheduling.  May consider doing both surgeries about six weeks apart.  Margart SicklesJoshua Leily Capek, PA-C 11/07/2018, 5:41 PM

## 2018-11-07 NOTE — H&P (Signed)
Natalie Snyder is an 52 y.o. female.   Chief Complaint: bilat wrist pain HPI: The patient has a BMI of 50 and meets the AMA guidelines for morbid (severe) obesity with a BMI>40.0, and I have recommended weight loss.  She has had the insidious onset of pain.  She has nerve conduction showing severe carpal tunnel, double crush syndrome with history of diabetes.  Past history:  The patient has a history of HTN and DM with a history of stroke and was hospitalized in 2017, on aspirin and Plavix.  Other medicines are dictated in the record.  She is on melatonin and has been on gabapentin, metformin, pantoprazole, HCTZ, atorvastatin, amlodipine, and levothyroxine.   Past Medical History:  Diagnosis Date  . Depression   . Diabetes mellitus without complication (HCC)   . Herpes   . Hypertension   . Stroke (HCC) 05/2016    Past Surgical History:  Procedure Laterality Date  . CESAREAN SECTION      Family History  Problem Relation Age of Onset  . Pulmonary embolism Mother 72  . Aneurysm Mother   . CAD Father        5 MIs, first while in 70s  . Parkinson's disease Father   . Hypertension Father   . Stroke Maternal Grandmother    Social History:  reports that she has quit smoking. Her smoking use included cigarettes. She has a 10.00 pack-year smoking history. She has never used smokeless tobacco. She reports that she does not drink alcohol or use drugs.  Allergies:  Allergies  Allergen Reactions  . Latex Rash    (Not in a hospital admission)   No results found for this or any previous visit (from the past 48 hour(s)). No results found.  Review of Systems  Musculoskeletal: Positive for joint pain.  Neurological: Positive for tingling.  Psychiatric/Behavioral: Positive for depression. The patient is nervous/anxious.   All other systems reviewed and are negative.   There were no vitals taken for this visit. Physical Exam  Constitutional: She is oriented to person, place, and time.  She appears well-developed and well-nourished. No distress.  HENT:  Head: Normocephalic and atraumatic.  Eyes: Pupils are equal, round, and reactive to light. Conjunctivae and EOM are normal.  Neck: Normal range of motion. Neck supple.  Cardiovascular: Normal rate and intact distal pulses.  Respiratory: Effort normal. No respiratory distress.  GI: Soft. She exhibits no distension.  Musculoskeletal:     Comments: Positive tinels R wrist   Neurological: She is alert and oriented to person, place, and time.  Skin: Skin is warm and dry. No rash noted. No erythema.  Psychiatric: She has a normal mood and affect. Her behavior is normal.     Assessment/Plan Right Carpal Tunnel Syndrome  1.  She is definitely a candidate for surgery.  She is young.  The risks and benefits have been discussed in detail.  Will need obviously the potential to go off both aspirin and Plavix, certainly off Plavix and preferably off both.  I think with the history of stroke we could probably do that, but will check with the medical team. 2.  Choice anesthetic.  We will probably lean towards nothing on a general level due to BMI, and based on the hospital rule we will need to do this in the hospital, although it is not clear if she would have to stay overnight.   3.  I do think based on these symptoms that she is a candidate for   surgery, and, again, the risks and benefits have been discussed in detail with the patient.  Proceed onward with scheduling.  May consider doing both surgeries about six weeks apart.  Margart SicklesJoshua Monifah Freehling, PA-C 11/07/2018, 5:41 PM

## 2018-11-09 NOTE — Progress Notes (Signed)
Anesthesia Chart Review   Case:  [id number] Date/Time:  11/11/18 1003   Procedure:  CARPAL TUNNEL RELEASE (Right )   Anesthesia type:  Choice   Pre-op diagnosis:  RIGHT CARPAL TUNNEL SYNDROME   Location:  [facility]   Surgeon:  Frederico Hamman, MD      DISCUSSION: 52yo former smoker (10 pack years) with h/o DM II, HTN, previous stroke (2017), depression scheduled for above surgery on 11/11/18 with Dr. Frederico Hamman.    H/o two strokes 05/2016 and 08/2016.  She was last seen by neurology 12/08/16, Dr. Levert Feinstein.  At this visit per Dr. Zannie Cove note DM II was poorly controlled.  At this time DM II better controlled with normal labs at PST visit on 11/04/18. Pt reports she is no longer taking Plavix, reports she was advised to discontinue. She is followed by Dr. Donzetta Sprung, last seen 3 months ago, stable at this visit, no changes to medications made.  Pt is stable, can proceed with planned procedure barring acute status change.  VS: BP (!) 143/85   Pulse 86   Temp 36.8 C (Oral)   Resp 20   Ht 5\' 4"  (1.626 m)   Wt (!) 136.2 kg   SpO2 96%   BMI 51.54 kg/m   PROVIDERS: Richardean Chimera, MD is PCP   LABS: Labs reviewed: Acceptable for surgery. (all labs ordered are listed, but only abnormal results are displayed)  Labs Reviewed  HEMOGLOBIN A1C - Abnormal; Notable for the following components:      Result Value   Hgb A1c MFr Bld 5.8 (*)    All other components within normal limits  GLUCOSE, CAPILLARY - Abnormal; Notable for the following components:   Glucose-Capillary 64 (*)    All other components within normal limits  BASIC METABOLIC PANEL  CBC  GLUCOSE, CAPILLARY     IMAGES:   EKG: 11/04/18 Rate 86 bpm Suspect limb reversal Normal sinus rhythm Poor R wave progression Low voltage limb leads Abnormal ECG No significant change since last tracing  CV: Echo 09/22/16 Study Conclusions  - Left ventricle: The cavity size was normal. Wall thickness was    increased in a pattern of moderate LVH. Systolic function was   normal. The estimated ejection fraction was in the range of 60%   to 65%. Wall motion was normal; there were no regional wall   motion abnormalities. Left ventricular diastolic function   parameters were normal. - Aortic valve: Mildly calcified annulus. Trileaflet. - Right atrium: Central venous pressure (est): 3 mm Hg. - Atrial septum: No defect or patent foramen ovale was identified. - Tricuspid valve: There was physiologic regurgitation. - Pulmonary arteries: Systolic pressure could not be accurately   estimated. - Pericardium, extracardiac: There was no pericardial effusion.  Impressions:  - Moderate LVH with LVEF 60-65% and grossly normal diastolic   function. Mildly calcified aortic annulus. No obvious PFO or ASD.  Carotid Doppler 05/22/16 IMPRESSION: Color duplex indicates minimal heterogeneous plaque, with no hemodynamically significant stenosis by duplex criteria in the extracranial cerebrovascular circulation.  Incidental finding of a right thyroid nodule. Diagnostic ultrasound thyroid recommended for further evaluation. Past Medical History:  Diagnosis Date  . Depression   . Diabetes mellitus without complication (HCC)   . Herpes   . Hypertension   . Stroke [facility]) 05/2016    Past Surgical History:  Procedure Laterality Date  . CESAREAN SECTION      MEDICATIONS: . amLODipine (NORVASC) 10 MG tablet  .  aspirin EC 81 MG EC tablet  . atorvastatin (LIPITOR) 40 MG tablet  . clopidogrel (PLAVIX) 75 MG tablet  . escitalopram (LEXAPRO) 10 MG tablet  . gabapentin (NEURONTIN) 600 MG tablet  . insulin NPH-regular Human (70-30) 100 UNIT/ML injection  . levothyroxine (SYNTHROID, LEVOTHROID) 100 MCG tablet  . lisinopril-hydrochlorothiazide (PRINZIDE,ZESTORETIC) 20-12.5 MG tablet  . Melatonin 10 MG TABS  . metFORMIN (GLUCOPHAGE-XR) 500 MG 24 hr tablet  . metoprolol succinate (TOPROL-XL) 50 MG 24 hr tablet   . pantoprazole (PROTONIX) 40 MG tablet  . phentermine 37.5 MG capsule   No current facility-administered medications for this encounter.      Jodell CiproJessica Jossilyn Benda, PA-C [facility] [phone] 11/09/18 1:20 PM

## 2018-11-09 NOTE — Anesthesia Preprocedure Evaluation (Addendum)
Anesthesia Evaluation  Patient identified by MRN, date of birth, ID band Patient awake    Reviewed: Allergy & Precautions, NPO status , Patient's Chart, lab work & pertinent test results  Airway Mallampati: I  TM Distance: >3 FB Neck ROM: Full    Dental no notable dental hx.    Pulmonary former smoker,    Pulmonary exam normal breath sounds clear to auscultation       Cardiovascular hypertension, Pt. on medications and Pt. on home beta blockers Normal cardiovascular exam Rhythm:Regular Rate:Normal  ECG: rate 86. Normal sinus rhythm Right superior axis deviation   Neuro/Psych PSYCHIATRIC DISORDERS Depression Right sided numbness  CVA, Residual Symptoms    GI/Hepatic Neg liver ROS, GERD  Medicated and Controlled,  Endo/Other  diabetes, Insulin Dependent, Oral Hypoglycemic AgentsHypothyroidism Morbid obesity (Super)  Renal/GU negative Renal ROS     Musculoskeletal negative musculoskeletal ROS (+)   Abdominal   Peds  Hematology negative hematology ROS (+)   Anesthesia Other Findings RIGHT CARPAL TUNNEL SYNDROME  Reproductive/Obstetrics                           Anesthesia Physical Anesthesia Plan  ASA: IV  Anesthesia Plan: Regional   Post-op Pain Management:    Induction:   PONV Risk Score and Plan: 2 and Midazolam, Ondansetron and Treatment may vary due to age or medical condition  Airway Management Planned: Natural Airway  Additional Equipment:   Intra-op Plan:   Post-operative Plan:   Informed Consent: I have reviewed the patients History and Physical, chart, labs and discussed the procedure including the risks, benefits and alternatives for the proposed anesthesia with the patient or authorized representative who has indicated his/her understanding and acceptance.     Dental advisory given  Plan Discussed with: CRNA  Anesthesia Plan Comments: (Reviewed PST note 11/04/18,  Jodell Cipro, PA-C Multi-modal: acetaminophen and regional)      Anesthesia Quick Evaluation

## 2018-11-10 MED ORDER — DEXTROSE 5 % IV SOLN
3.0000 g | INTRAVENOUS | Status: AC
  Administered 2018-11-11: 3 g via INTRAVENOUS
  Filled 2018-11-10: qty 3

## 2018-11-11 ENCOUNTER — Ambulatory Visit (HOSPITAL_COMMUNITY)
Admission: RE | Admit: 2018-11-11 | Discharge: 2018-11-11 | Disposition: A | Discharge status: Home / Self Care | Payer: BLUE CROSS/BLUE SHIELD | Attending: Orthopedic Surgery | Admitting: Orthopedic Surgery

## 2018-11-11 ENCOUNTER — Encounter (HOSPITAL_COMMUNITY): Payer: Self-pay | Admitting: *Deleted

## 2018-11-11 ENCOUNTER — Ambulatory Visit (HOSPITAL_COMMUNITY): Payer: BLUE CROSS/BLUE SHIELD | Admitting: Certified Registered Nurse Anesthetist

## 2018-11-11 ENCOUNTER — Encounter (HOSPITAL_COMMUNITY)
Admission: RE | Disposition: A | Discharge status: Home / Self Care | Payer: Self-pay | Source: Home / Self Care | Attending: Orthopedic Surgery

## 2018-11-11 ENCOUNTER — Other Ambulatory Visit: Payer: Self-pay

## 2018-11-11 ENCOUNTER — Ambulatory Visit (HOSPITAL_COMMUNITY): Payer: BLUE CROSS/BLUE SHIELD | Admitting: Physician Assistant

## 2018-11-11 DIAGNOSIS — Z87891 Personal history of nicotine dependence: Secondary | ICD-10-CM | POA: Insufficient documentation

## 2018-11-11 DIAGNOSIS — F329 Major depressive disorder, single episode, unspecified: Secondary | ICD-10-CM | POA: Insufficient documentation

## 2018-11-11 DIAGNOSIS — Z7982 Long term (current) use of aspirin: Secondary | ICD-10-CM | POA: Diagnosis not present

## 2018-11-11 DIAGNOSIS — Z79899 Other long term (current) drug therapy: Secondary | ICD-10-CM | POA: Insufficient documentation

## 2018-11-11 DIAGNOSIS — Z79890 Hormone replacement therapy (postmenopausal): Secondary | ICD-10-CM | POA: Insufficient documentation

## 2018-11-11 DIAGNOSIS — Z7902 Long term (current) use of antithrombotics/antiplatelets: Secondary | ICD-10-CM | POA: Diagnosis not present

## 2018-11-11 DIAGNOSIS — E119 Type 2 diabetes mellitus without complications: Secondary | ICD-10-CM | POA: Insufficient documentation

## 2018-11-11 DIAGNOSIS — Z91040 Latex allergy status: Secondary | ICD-10-CM | POA: Diagnosis not present

## 2018-11-11 DIAGNOSIS — Z7984 Long term (current) use of oral hypoglycemic drugs: Secondary | ICD-10-CM | POA: Diagnosis not present

## 2018-11-11 DIAGNOSIS — K219 Gastro-esophageal reflux disease without esophagitis: Secondary | ICD-10-CM | POA: Diagnosis not present

## 2018-11-11 DIAGNOSIS — Z8673 Personal history of transient ischemic attack (TIA), and cerebral infarction without residual deficits: Secondary | ICD-10-CM | POA: Diagnosis not present

## 2018-11-11 DIAGNOSIS — G5601 Carpal tunnel syndrome, right upper limb: Secondary | ICD-10-CM | POA: Diagnosis not present

## 2018-11-11 DIAGNOSIS — I10 Essential (primary) hypertension: Secondary | ICD-10-CM | POA: Diagnosis not present

## 2018-11-11 DIAGNOSIS — Z6841 Body Mass Index (BMI) 40.0 and over, adult: Secondary | ICD-10-CM | POA: Diagnosis not present

## 2018-11-11 HISTORY — PX: CARPAL TUNNEL RELEASE: SHX101

## 2018-11-11 LAB — GLUCOSE, CAPILLARY
Glucose-Capillary: 67 mg/dL — ABNORMAL LOW (ref 70–99)
Glucose-Capillary: 112 mg/dL — ABNORMAL HIGH (ref 70–99)
Glucose-Capillary: 83 mg/dL (ref 70–99)
Glucose-Capillary: 84 mg/dL (ref 70–99)

## 2018-11-11 SURGERY — CARPAL TUNNEL RELEASE
Anesthesia: Regional | Site: Hand | Laterality: Right

## 2018-11-11 MED ORDER — LIDOCAINE 2% (20 MG/ML) 5 ML SYRINGE
INTRAMUSCULAR | Status: DC | PRN
  Administered 2018-11-11: 20 mg via INTRAVENOUS

## 2018-11-11 MED ORDER — LACTATED RINGERS IV SOLN
INTRAVENOUS | Status: DC
  Administered 2018-11-11: 10:00:00 via INTRAVENOUS

## 2018-11-11 MED ORDER — LIDOCAINE HCL (PF) 1 % IJ SOLN
INTRAMUSCULAR | Status: DC | PRN
  Administered 2018-11-11: 4 mL

## 2018-11-11 MED ORDER — MIDAZOLAM HCL 2 MG/2ML IJ SOLN
INTRAMUSCULAR | Status: AC
  Administered 2018-11-11: 2 mg via INTRAVENOUS
  Filled 2018-11-11: qty 2

## 2018-11-11 MED ORDER — FENTANYL CITRATE (PF) 100 MCG/2ML IJ SOLN
INTRAMUSCULAR | Status: AC
  Filled 2018-11-11: qty 2

## 2018-11-11 MED ORDER — SODIUM CHLORIDE 0.9 % IV SOLN: INTRAVENOUS | Status: DC

## 2018-11-11 MED ORDER — ONDANSETRON HCL 4 MG/2ML IJ SOLN
INTRAMUSCULAR | Status: DC | PRN
  Administered 2018-11-11: 4 mg via INTRAVENOUS

## 2018-11-11 MED ORDER — BUPIVACAINE HCL (PF) 0.25 % IJ SOLN
INTRAMUSCULAR | Status: DC | PRN
  Administered 2018-11-11: 4 mL

## 2018-11-11 MED ORDER — FENTANYL CITRATE (PF) 100 MCG/2ML IJ SOLN: 50.0000 ug | INTRAMUSCULAR | Status: DC

## 2018-11-11 MED ORDER — PROPOFOL 10 MG/ML IV BOLUS
INTRAVENOUS | Status: DC | PRN
  Administered 2018-11-11 (×2): 10 mg via INTRAVENOUS

## 2018-11-11 MED ORDER — ACETAMINOPHEN 500 MG PO TABS
1000.0000 mg | ORAL_TABLET | Freq: Once | ORAL | Status: AC
  Administered 2018-11-11: 1000 mg via ORAL

## 2018-11-11 MED ORDER — BUPIVACAINE HCL (PF) 0.25 % IJ SOLN
INTRAMUSCULAR | Status: AC
  Filled 2018-11-11: qty 30

## 2018-11-11 MED ORDER — LIDOCAINE 2% (20 MG/ML) 5 ML SYRINGE
INTRAMUSCULAR | Status: AC
  Filled 2018-11-11: qty 5

## 2018-11-11 MED ORDER — CHLORHEXIDINE GLUCONATE 4 % EX LIQD: 60.0000 mL | Freq: Once | CUTANEOUS | Status: DC

## 2018-11-11 MED ORDER — MIDAZOLAM HCL 2 MG/2ML IJ SOLN
1.0000 mg | INTRAMUSCULAR | Status: DC
  Administered 2018-11-11: 2 mg via INTRAVENOUS

## 2018-11-11 MED ORDER — ONDANSETRON HCL 4 MG/2ML IJ SOLN: 4.0000 mg | Freq: Once | INTRAMUSCULAR | Status: DC | PRN

## 2018-11-11 MED ORDER — PROPOFOL 500 MG/50ML IV EMUL
INTRAVENOUS | Status: DC | PRN
  Administered 2018-11-11: 25 ug/kg/min via INTRAVENOUS

## 2018-11-11 MED ORDER — LIDOCAINE-EPINEPHRINE (PF) 2 %-1:200000 IJ SOLN
INTRAMUSCULAR | Status: DC | PRN
  Administered 2018-11-11: 20 mL via PERINEURAL

## 2018-11-11 MED ORDER — HYDROCODONE-ACETAMINOPHEN 5-325 MG PO TABS: 1.0000 | ORAL_TABLET | ORAL | 0 refills | Status: DC | PRN

## 2018-11-11 MED ORDER — LIDOCAINE HCL (PF) 1 % IJ SOLN
INTRAMUSCULAR | Status: AC
  Filled 2018-11-11: qty 30

## 2018-11-11 MED ORDER — PROPOFOL 10 MG/ML IV BOLUS
INTRAVENOUS | Status: AC
  Filled 2018-11-11: qty 20

## 2018-11-11 MED ORDER — DEXTROSE 50 % IV SOLN
0.5000 | Freq: Once | INTRAVENOUS | Status: AC
  Administered 2018-11-11: 25 mL via INTRAVENOUS

## 2018-11-11 MED ORDER — FENTANYL CITRATE (PF) 100 MCG/2ML IJ SOLN: 25.0000 ug | INTRAMUSCULAR | Status: DC | PRN

## 2018-11-11 SURGICAL SUPPLY — 33 items
BANDAGE ACE 3X5.8 VEL STRL LF (GAUZE/BANDAGES/DRESSINGS) ×3 IMPLANT
BNDG CMPR 9X4 STRL LF SNTH (GAUZE/BANDAGES/DRESSINGS) ×1
BNDG COHESIVE 4X5 TAN STRL (GAUZE/BANDAGES/DRESSINGS) ×3 IMPLANT
BNDG CONFORM 3 STRL LF (GAUZE/BANDAGES/DRESSINGS) ×3 IMPLANT
BNDG ESMARK 4X9 LF (GAUZE/BANDAGES/DRESSINGS) ×3 IMPLANT
CORD BIPOLAR FORCEPS 12FT (ELECTRODE) ×3 IMPLANT
COVER SURGICAL LIGHT HANDLE (MISCELLANEOUS) ×3 IMPLANT
COVER WAND RF STERILE (DRAPES) IMPLANT
CUFF TOURN SGL QUICK 18 (TOURNIQUET CUFF) IMPLANT
DRAPE SURG 17X11 SM STRL (DRAPES) ×3 IMPLANT
DRSG EMULSION OIL 3X3 NADH (GAUZE/BANDAGES/DRESSINGS) ×3 IMPLANT
DURAPREP 26ML APPLICATOR (WOUND CARE) ×3 IMPLANT
GAUZE SPONGE 4X4 12PLY STRL (GAUZE/BANDAGES/DRESSINGS) ×3 IMPLANT
GAUZE XEROFORM 1X8 LF (GAUZE/BANDAGES/DRESSINGS) ×3 IMPLANT
GLOVE BIOGEL PI IND STRL 6.5 (GLOVE) ×1 IMPLANT
GLOVE BIOGEL PI IND STRL 7.5 (GLOVE) ×1 IMPLANT
GLOVE BIOGEL PI IND STRL 8 (GLOVE) ×3 IMPLANT
GLOVE BIOGEL PI INDICATOR 6.5 (GLOVE) ×2
GLOVE BIOGEL PI INDICATOR 7.5 (GLOVE) ×2
GLOVE BIOGEL PI INDICATOR 8 (GLOVE) ×6
GOWN STRL REUS W/TWL XL LVL3 (GOWN DISPOSABLE) ×6 IMPLANT
KIT BASIN OR (CUSTOM PROCEDURE TRAY) ×3 IMPLANT
NEEDLE HYPO 22GX1.5 SAFETY (NEEDLE) ×3 IMPLANT
NS IRRIG 1000ML POUR BTL (IV SOLUTION) ×3 IMPLANT
PACK ORTHO EXTREMITY (CUSTOM PROCEDURE TRAY) ×3 IMPLANT
PAD CAST 3X4 CTTN HI CHSV (CAST SUPPLIES) ×1 IMPLANT
PADDING CAST COTTON 3X4 STRL (CAST SUPPLIES) ×3
PROTECTOR NERVE ULNAR (MISCELLANEOUS) ×3 IMPLANT
SLING ARM FOAM STRAP LRG (SOFTGOODS) ×3 IMPLANT
SUCTION FRAZIER HANDLE 10FR (MISCELLANEOUS)
SUCTION TUBE FRAZIER 10FR DISP (MISCELLANEOUS) IMPLANT
SUT ETHILON 4 0 PS 2 18 (SUTURE) ×3 IMPLANT
TOWEL OR 17X26 10 PK STRL BLUE (TOWEL DISPOSABLE) ×3 IMPLANT

## 2018-11-11 NOTE — Interval H&P Note (Signed)
History and Physical Interval Note:  11/11/2018 9:26 AM  Natalie Snyder  has presented today for surgery, with the diagnosis of RIGHT CARPAL TUNNEL SYNDROME  The various methods of treatment have been discussed with the patient and family. After consideration of risks, benefits and other options for treatment, the patient has consented to  Procedure(s): CARPAL TUNNEL RELEASE (Right) as a surgical intervention .  The patient's history has been reviewed, patient examined, no change in status, stable for surgery.  I have reviewed the patient's chart and labs.  Questions were answered to the patient's satisfaction.     W D Bexton Haak Jr   

## 2018-11-11 NOTE — Interval H&P Note (Signed)
History and Physical Interval Note:  11/11/2018 9:26 AM  Natalie Snyder  has presented today for surgery, with the diagnosis of RIGHT CARPAL TUNNEL SYNDROME  The various methods of treatment have been discussed with the patient and family. After consideration of risks, benefits and other options for treatment, the patient has consented to  Procedure(s): CARPAL TUNNEL RELEASE (Right) as a surgical intervention .  The patient's history has been reviewed, patient examined, no change in status, stable for surgery.  I have reviewed the patient's chart and labs.  Questions were answered to the patient's satisfaction.     Thera Flake

## 2018-11-11 NOTE — Anesthesia Procedure Notes (Signed)
Anesthesia Regional Block: Supraclavicular block   Pre-Anesthetic Checklist: ,, timeout performed, Correct Patient, Correct Site, Correct Laterality, Correct Procedure, Correct Position, site marked, Risks and benefits discussed,  Surgical consent,  Pre-op evaluation,  At surgeon's request and post-op pain management  Laterality: Right  Prep: chloraprep       Needles:  Injection technique: Single-shot  Needle Type: Echogenic Stimulator Needle     Needle Length: 9cm  Needle Gauge: 21     Additional Needles:   Procedures:,,,, ultrasound used (permanent image in chart),,,,  Narrative:  Start time: 11/11/2018 10:10 AM End time: 11/11/2018 10:20 AM Injection made incrementally with aspirations every 5 mL.  Performed by: Personally  Anesthesiologist: Leonides Grills, MD  Additional Notes: Functioning IV was confirmed and monitors were applied.  A timeout was performed. Sterile prep, hand hygiene and sterile gloves were used. A 55mm 21ga Arrow echogenic stimulator needle was used. Negative aspiration and negative test dose prior to incremental administration of local anesthetic.The patient tolerated the procedure well.  Ultrasound guidance: relevent anatomy identified, needle position confirmed, local anesthetic spread visualized around nerve(s), vascular puncture avoided.  Image printed for medical record.

## 2018-11-11 NOTE — Anesthesia Postprocedure Evaluation (Signed)
Anesthesia Post Note  Patient: DANNIELA PALLANES  Procedure(s) Performed: CARPAL TUNNEL RELEASE (Right Hand)     Patient location during evaluation: PACU Anesthesia Type: Regional Level of consciousness: awake and alert Pain management: pain level controlled Vital Signs Assessment: post-procedure vital signs reviewed and stable Respiratory status: spontaneous breathing, nonlabored ventilation, respiratory function stable and patient connected to nasal cannula oxygen Cardiovascular status: stable and blood pressure returned to baseline Postop Assessment: no apparent nausea or vomiting Anesthetic complications: no    Last Vitals:  Vitals:   11/11/18 1202 11/11/18 1240  BP: 125/71 130/70  Pulse: 68 72  Resp: 16 20  Temp: 36.6 C 36.6 C  SpO2:  100%    Last Pain:  Vitals:   11/11/18 1240  TempSrc:   PainSc: 0-No pain                 Ryan P Ellender

## 2018-11-11 NOTE — Progress Notes (Signed)
Assisted Dr. Ellender with right, ultrasound guided, supraclavicular block. Side rails up, monitors on throughout procedure. See vital signs in flow sheet. Tolerated Procedure well. 

## 2018-11-11 NOTE — Brief Op Note (Signed)
11/11/2018  11:06 AM  PATIENT:  Natalie Snyder  53 y.o. female  PRE-OPERATIVE DIAGNOSIS:  RIGHT CARPAL TUNNEL SYNDROME  POST-OPERATIVE DIAGNOSIS:  RIGHT CARPAL TUNNEL SYNDROME  PROCEDURE:  Procedure(s): CARPAL TUNNEL RELEASE (Right)  SURGEON:  Surgeon(s) and Role:    * Frederico Hamman, MD - Primary  PHYSICIAN ASSISTANT: Margart Sickles, PA-C  ASSISTANTS:    ANESTHESIA:   local, regional and IV sedation  EBL:  minimal   BLOOD ADMINISTERED:none  DRAINS: none   LOCAL MEDICATIONS USED:  MARCAINE     SPECIMEN:  No Specimen  DISPOSITION OF SPECIMEN:  N/A  COUNTS:  YES  TOURNIQUET:   Total Tourniquet Time Documented: Forearm (Right) - 15 minutes Total: Forearm (Right) - 15 minutes   DICTATION: .Other Dictation: Dictation Number unknown  PLAN OF CARE: Discharge to home after PACU  PATIENT DISPOSITION:  PACU - hemodynamically stable.   Delay start of Pharmacological VTE agent (>24hrs) due to surgical blood loss or risk of bleeding: not applicable

## 2018-11-11 NOTE — Transfer of Care (Signed)
Immediate Anesthesia Transfer of Care Note  Patient: Natalie Snyder  Procedure(s) Performed: CARPAL TUNNEL RELEASE (Right Hand)  Patient Location: PACU  Anesthesia Type:Regional  Level of Consciousness: awake and patient cooperative  Airway & Oxygen Therapy: Patient Spontanous Breathing and Patient connected to face mask oxygen  Post-op Assessment: Report given to RN and Post -op Vital signs reviewed and stable  Post vital signs: Reviewed and stable  Last Vitals:  Vitals Value Taken Time  BP 109/68 11/11/2018 11:16 AM  Temp    Pulse 81 11/11/2018 11:19 AM  Resp 20 11/11/2018 11:19 AM  SpO2 100 % 11/11/2018 11:19 AM  Vitals shown include unvalidated device data.  Last Pain:  Vitals:   11/11/18 1015  TempSrc:   PainSc: 0-No pain      Patients Stated Pain Goal: 3 (11/11/18 6808)  Complications: No apparent anesthesia complications

## 2018-11-11 NOTE — Discharge Instructions (Signed)
Diet: As you were doing prior to hospitalization   Activity: Increase activity slowly as tolerated  No lifting or driving today  Shower: May shower without a dressing on post op day #3, NO SOAKING in tub or submerging hand   Dressing: You may change your dressing on post op day #3.  Then change the dressing daily with sterile 4"x4"s gauze dressing   Weight Bearing: no lifting with operative hand.  To prevent constipation: you may use a stool softener such as -  Colace ( over the counter) 100 mg by mouth twice a day  Drink plenty of fluids ( prune juice may be helpful) and high fiber foods  Miralax ( over the counter) for constipation as needed.   Precautions: If you experience chest pain or shortness of breath - call 911 immediately For transfer to the hospital emergency department!!  If you develop a fever greater that 101 F, purulent drainage from wound, increased redness or drainage from wound, or calf pain -- Call the office   Follow- Up Appointment: Please call for an appointment to be seen in 2 weeks  [place] - [phone]

## 2018-11-12 ENCOUNTER — Encounter (HOSPITAL_COMMUNITY): Payer: Self-pay | Admitting: Orthopedic Surgery

## 2018-11-14 NOTE — Op Note (Signed)
NAME: HEIRESS, MATSUSHITA MEDICAL RECORD OF:[id number] ACCOUNT [id number] DATE OF BIRTH:[birth year] FACILITY: [facility] LOCATION: [facility] PHYSICIAN:W. Manuelita Moxon JR., MD  OPERATIVE REPORT  DATE OF PROCEDURE:  11/11/2018  PREOPERATIVE DIAGNOSIS:  Right carpal tunnel.  POSTOPERATIVE DIAGNOSIS:  Right carpal tunnel.  OPERATION:  Right carpal tunnel release.  SURGEON:  Freddrick March., MD  ASSISTANTVincent Peyer  ANESTHESIA:  Block with local supplementation.  TOURNIQUET TIME:  Ten minutes.  DESCRIPTION OF PROCEDURE:  Exsanguination with arm forearm tourniquet inflated to 250.  Curvilinear incision was made just ulnar thenar crease.  Dissection through the subcutaneous tissues.  We did complete release of transverse carpal ligament overlying  the median nerve distally to the level of superficial arch, proximally including the antebrachial fascia of the wrist.  The nerve showed moderate compression.  Complete release of the ligament was carried out.  Wound was irrigated, closed in nylon.  We  then infiltrated 7-8 mL of 0.5% with Xylocaine to supplement the block.  A lightly compressive sterile dressing was applied.  Tourniquet was released after application of the dressing.  LN/NUANCE  D:11/11/2018 T:11/11/2018 JOB:[id number]

## 2018-11-22 DIAGNOSIS — G5603 Carpal tunnel syndrome, bilateral upper limbs: Secondary | ICD-10-CM | POA: Diagnosis not present

## 2018-12-02 DIAGNOSIS — E1165 Type 2 diabetes mellitus with hyperglycemia: Secondary | ICD-10-CM | POA: Diagnosis not present

## 2018-12-02 DIAGNOSIS — E782 Mixed hyperlipidemia: Secondary | ICD-10-CM | POA: Diagnosis not present

## 2018-12-02 DIAGNOSIS — E041 Nontoxic single thyroid nodule: Secondary | ICD-10-CM | POA: Diagnosis not present

## 2018-12-02 DIAGNOSIS — I10 Essential (primary) hypertension: Secondary | ICD-10-CM | POA: Diagnosis not present

## 2018-12-02 DIAGNOSIS — E1142 Type 2 diabetes mellitus with diabetic polyneuropathy: Secondary | ICD-10-CM | POA: Diagnosis not present

## 2018-12-05 DIAGNOSIS — I10 Essential (primary) hypertension: Secondary | ICD-10-CM | POA: Diagnosis not present

## 2018-12-05 DIAGNOSIS — E782 Mixed hyperlipidemia: Secondary | ICD-10-CM | POA: Diagnosis not present

## 2018-12-05 DIAGNOSIS — I63312 Cerebral infarction due to thrombosis of left middle cerebral artery: Secondary | ICD-10-CM | POA: Diagnosis not present

## 2018-12-05 DIAGNOSIS — E1165 Type 2 diabetes mellitus with hyperglycemia: Secondary | ICD-10-CM | POA: Diagnosis not present

## 2018-12-14 NOTE — Progress Notes (Signed)
Please place orders in epic pt. Has a preop 2-27 thank You

## 2018-12-14 NOTE — Patient Instructions (Addendum)
Natalie Snyder  12/14/2018   Your procedure is scheduled on: 12-23-18   Report to [facility] Main  Entrance              Report to admitting at      [phone] AM    Call this number if you have problems the morning of surgery [phone]    Remember: Do not eat food or drink liquids :After Midnight.  BRUSH YOUR TEETH MORNING OF SURGERY AND RINSE YOUR MOUTH OUT, NO CHEWING GUM CANDY OR MINTS.     Take these medicines the morning of surgery with A SIP OF WATER: protonix,metoprolol,levothyroxine, gabapentin, lexapro, amlodipine DO NOT TAKE ANY DIABETIC MEDICATIONS DAY OF YOUR SURGERY               How to Manage Your Diabetes               Before and After Surgery  Why is it important to control my blood sugar before and after surgery? . Improving blood sugar levels before and after surgery helps healing and can limit problems. . A way of improving blood sugar control is eating a healthy diet by: o  Eating less sugar and carbohydrates o  Increasing activity/exercise o  Talking with your doctor about reaching your blood sugar goals . High blood sugars (greater than 180 mg/dL) can raise your risk of infections and slow your recovery, so you will need to focus on controlling your diabetes during the weeks before surgery. . Make sure that the doctor who takes care of your diabetes knows about your planned surgery including the date and location.  How do I manage my blood sugar before surgery? . Check your blood sugar at least 4 times a day, starting 2 days before surgery, to make sure that the level is not too high or low. o Check your blood sugar the morning of your surgery when you wake up and every 2 hours until you get to the Short Stay unit. . If your blood sugar is less than 70 mg/dL, you will need to treat for low blood sugar: o Do not take insulin. o Treat a low blood sugar (less than 70 mg/dL) with  cup of clear juice (cranberry or apple), 4 glucose tablets,  OR glucose gel. o Recheck blood sugar in 15 minutes after treatment (to make sure it is greater than 70 mg/dL). If your blood sugar is not greater than 70 mg/dL on recheck, call [phone] for further instructions. . Report your blood sugar to the short stay nurse when you get to Short Stay.  . If you are admitted to the hospital after surgery: o Your blood sugar will be checked by the staff and you will probably be given insulin after surgery (instead of oral diabetes medicines) to make sure you have good blood sugar levels. o The goal for blood sugar control after surgery is 80-180 mg/dL.   WHAT DO I DO ABOUT MY DIABETES MEDICATION?  Marland Kitchen Do not take oral diabetes medicines (pills) the morning of surgery.  . THE NIGHT BEFORE SURGERY, take 50 % of your 70/30 insulin         . THE MORNING OF SURGERY, take 0  units of         insulin.  . The day of surgery, do not take other diabetes injectables, including Byetta (exenatide), Bydureon (exenatide ER), Victoza (liraglutide), or  Trulicity (dulaglutide).  . If your CBG is greater than 220 mg/dL, you may take  of your sliding scale  . (correction) dose of insulin.     Patient Signature:  Date:   Nurse Signature:  Date:   Reviewed and Endorsed by [facility] Patient Education Committee, August 2015                  You may not have any metal on your body including hair pins and              piercings  Do not wear jewelry, make-up, lotions, powders or perfumes, deodorant             Do not wear nail polish.  Do not shave  48 hours prior to surgery.               Do not bring valuables to the hospital. [facility] IS NOT             RESPONSIBLE   FOR VALUABLES.  Contacts, dentures or bridgework may not be worn into surgery.       Patients discharged the day of surgery will not be allowed to drive home. IF YOU ARE HAVING SURGERY AND GOING HOME THE SAME DAY, YOU MUST HAVE AN ADULT TO DRIVE YOU HOME AND BE WITH YOU FOR 24 HOURS. YOU MAY  GO HOME BY TAXI OR UBER OR ORTHERWISE, BUT AN ADULT MUST ACCOMPANY YOU HOME AND STAY WITH YOU FOR 24 HOURS.  Name and phone number of your driver:  Special Instructions: N/A              Please read over the following fact sheets you were given: _____________________________________________________________________             [facility] - Preparing for Surgery Before surgery, you can play an important role.  Because skin is not sterile, your skin needs to be as free of germs as possible.  You can reduce the number of germs on your skin by washing with CHG (chlorahexidine gluconate) soap before surgery.  CHG is an antiseptic cleaner which kills germs and bonds with the skin to continue killing germs even after washing. Please DO NOT use if you have an allergy to CHG or antibacterial soaps.  If your skin becomes reddened/irritated stop using the CHG and inform your nurse when you arrive at Short Stay. Do not shave (including legs and underarms) for at least 48 hours prior to the first CHG shower.  You may shave your face/neck. Please follow these instructions carefully:  1.  Shower with CHG Soap the night before surgery and the  morning of Surgery.  2.  If you choose to wash your hair, wash your hair first as usual with your  normal  shampoo.  3.  After you shampoo, rinse your hair and body thoroughly to remove the  shampoo.                           4.  Use CHG as you would any other liquid soap.  You can apply chg directly  to the skin and wash                       Gently with a scrungie or clean washcloth.  5.  Apply the CHG Soap to your body ONLY FROM THE NECK DOWN.   Do not use on face/ open  Wound or open sores. Avoid contact with eyes, ears mouth and genitals (private parts).                       Wash face,  Genitals (private parts) with your normal soap.             6.  Wash thoroughly, paying special attention to the area where your surgery  will be  performed.  7.  Thoroughly rinse your body with warm water from the neck down.  8.  DO NOT shower/wash with your normal soap after using and rinsing off  the CHG Soap.                9.  Pat yourself dry with a clean towel.            10.  Wear clean pajamas.            11.  Place clean sheets on your bed the night of your first shower and do not  sleep with pets. Day of Surgery : Do not apply any lotions/deodorants the morning of surgery.  Please wear clean clothes to the hospital/surgery center.  FAILURE TO FOLLOW THESE INSTRUCTIONS MAY RESULT IN THE CANCELLATION OF YOUR SURGERY PATIENT SIGNATURE_________________________________  NURSE SIGNATURE__________________________________  ________________________________________________________________________

## 2018-12-15 ENCOUNTER — Ambulatory Visit: Payer: Self-pay | Admitting: Physician Assistant

## 2018-12-15 ENCOUNTER — Inpatient Hospital Stay (HOSPITAL_COMMUNITY)
Admission: RE | Admit: 2018-12-15 | Discharge: 2018-12-15 | Disposition: A | Discharge status: Home / Self Care | Payer: BLUE CROSS/BLUE SHIELD | Source: Ambulatory Visit

## 2018-12-15 DIAGNOSIS — G510 Bell's palsy: Secondary | ICD-10-CM | POA: Diagnosis not present

## 2018-12-15 DIAGNOSIS — I10 Essential (primary) hypertension: Secondary | ICD-10-CM | POA: Diagnosis not present

## 2018-12-15 DIAGNOSIS — E1165 Type 2 diabetes mellitus with hyperglycemia: Secondary | ICD-10-CM | POA: Diagnosis not present

## 2018-12-15 NOTE — H&P (Signed)
  Natalie Snyder is an 53 y.o. female.   Chief Complaint: bilat wrist pain HPI: The patient has a BMI of 50 and meets the AMA guidelines for morbid (severe) obesity with a BMI>40.0, and I have recommended weight loss.  She has had the insidious onset of pain.  She has nerve conduction showing severe carpal tunnel, double crush syndrome with history of diabetes.   S/p R carpal tunnel release and doing well  Past history:  The patient has a history of HTN and DM with a history of stroke and was hospitalized in 2017, on aspirin and Plavix.  Other medicines are dictated in the record.  She is on melatonin and has been on gabapentin, metformin, pantoprazole, HCTZ, atorvastatin, amlodipine, and levothyroxine.       Past Medical History:  Diagnosis Date  . Depression   . Diabetes mellitus without complication (HCC)   . Herpes   . Hypertension   . Stroke [facility]) 05/2016         Past Surgical History:  Procedure Laterality Date  . CESAREAN SECTION           Family History  Problem Relation Age of Onset  . Pulmonary embolism Mother 23  . Aneurysm Mother   . CAD Father        5 MIs, first while in 58s  . Parkinson's disease Father   . Hypertension Father   . Stroke Maternal Grandmother    Social History:  reports that she has quit smoking. Her smoking use included cigarettes. She has a 10.00 pack-year smoking history. She has never used smokeless tobacco. She reports that she does not drink alcohol or use drugs.  Allergies:      Allergies  Allergen Reactions  . Latex Rash    (Not in a hospital admission)   LabResultsLast48Hours  No results found for this or any previous visit (from the past 48 hour(s)).   ImagingResults(Last48hours)  No results found.    Review of Systems  Musculoskeletal: Positive for joint pain.  Neurological: Positive for tingling.  Psychiatric/Behavioral: Positive for depression. The patient is nervous/anxious.   All  other systems reviewed and are negative.   There were no vitals taken for this visit. Physical Exam  Constitutional: She is oriented to person, place, and time. She appears well-developed and well-nourished. No distress.  HENT:  Head: Normocephalic and atraumatic.  Eyes: Pupils are equal, round, and reactive to light. Conjunctivae and EOM are normal.  Neck: Normal range of motion. Neck supple.  Cardiovascular: Normal rate and intact distal pulses.  Respiratory: Effort normal. No respiratory distress.  GI: Soft. She exhibits no distension.  Musculoskeletal:     Comments: Positive tinels L wrist   Neurological: She is alert and oriented to person, place, and time.  Skin: Skin is warm and dry. No rash noted. No erythema.  Psychiatric: She has a normal mood and affect. Her behavior is normal.     Assessment/Plan Left Carpal Tunnel Syndrome   She is not out of medicines, and is going to have the other side done.  Frankly, I think it would be reasonable to go ahead and give her # 30 pain pills, and that should definitely hold her until next surgery.  Will probably just do a recheck of her operative site and check to see how things are going in two weeks.  Anticipate possible opposite side being done sometime in March obviously based on patient's discretion.

## 2018-12-15 NOTE — H&P (View-Only) (Signed)
  Natalie Snyder is an 52 y.o. female.   Chief Complaint: bilat wrist pain HPI: The patient has a BMI of 50 and meets the AMA guidelines for morbid (severe) obesity with a BMI>40.0, and I have recommended weight loss.  She has had the insidious onset of pain.  She has nerve conduction showing severe carpal tunnel, double crush syndrome with history of diabetes.   S/p R carpal tunnel release and doing well  Past history:  The patient has a history of HTN and DM with a history of stroke and was hospitalized in 2017, on aspirin and Plavix.  Other medicines are dictated in the record.  She is on melatonin and has been on gabapentin, metformin, pantoprazole, HCTZ, atorvastatin, amlodipine, and levothyroxine.       Past Medical History:  Diagnosis Date  . Depression   . Diabetes mellitus without complication (HCC)   . Herpes   . Hypertension   . Stroke (HCC) 05/2016         Past Surgical History:  Procedure Laterality Date  . CESAREAN SECTION           Family History  Problem Relation Age of Onset  . Pulmonary embolism Mother 72  . Aneurysm Mother   . CAD Father        5 MIs, first while in 70s  . Parkinson's disease Father   . Hypertension Father   . Stroke Maternal Grandmother    Social History:  reports that she has quit smoking. Her smoking use included cigarettes. She has a 10.00 pack-year smoking history. She has never used smokeless tobacco. She reports that she does not drink alcohol or use drugs.  Allergies:      Allergies  Allergen Reactions  . Latex Rash    (Not in a hospital admission)   LabResultsLast48Hours  No results found for this or any previous visit (from the past 48 hour(s)).   ImagingResults(Last48hours)  No results found.    Review of Systems  Musculoskeletal: Positive for joint pain.  Neurological: Positive for tingling.  Psychiatric/Behavioral: Positive for depression. The patient is nervous/anxious.   All  other systems reviewed and are negative.   There were no vitals taken for this visit. Physical Exam  Constitutional: She is oriented to person, place, and time. She appears well-developed and well-nourished. No distress.  HENT:  Head: Normocephalic and atraumatic.  Eyes: Pupils are equal, round, and reactive to light. Conjunctivae and EOM are normal.  Neck: Normal range of motion. Neck supple.  Cardiovascular: Normal rate and intact distal pulses.  Respiratory: Effort normal. No respiratory distress.  GI: Soft. She exhibits no distension.  Musculoskeletal:     Comments: Positive tinels L wrist   Neurological: She is alert and oriented to person, place, and time.  Skin: Skin is warm and dry. No rash noted. No erythema.  Psychiatric: She has a normal mood and affect. Her behavior is normal.     Assessment/Plan Left Carpal Tunnel Syndrome   She is not out of medicines, and is going to have the other side done.  Frankly, I think it would be reasonable to go ahead and give her # 30 pain pills, and that should definitely hold her until next surgery.  Will probably just do a recheck of her operative site and check to see how things are going in two weeks.  Anticipate possible opposite side being done sometime in March obviously based on patient's discretion.   

## 2018-12-20 NOTE — Patient Instructions (Addendum)
Natalie Snyder  12/20/2018        Your procedure is scheduled on:   12-23-2018   Report to [facility] Main  Entrance,  Report to admitting at  7:45 AM    Call this number if you have problems the morning of surgery [phone]        Remember: Do not eat food or drink liquids :After Midnight.  This includes no water, candy, gum, mints.  BRUSH YOUR TEETH MORNING OF SURGERY AND RINSE YOUR MOUTH OUT          Take these medicines the morning of surgery with A SIP OF WATER:   Pantoprazole (protonix),  Metoprolol,  Levothyroxine (synthroid),  Gabapentin,  Escitalopram (lexapro),  Amlodipine (norvasc)                                   You may not have any metal on your body including hair pins and piercings             Do not wear jewelry, make-up, lotions, powders or perfumes, deodorant             Do not wear nail polish.  Do not shave  48 hours prior to surgery.         Do not bring valuables to the hospital. [facility] IS NOT             RESPONSIBLE   FOR VALUABLES.  Contacts, dentures or bridgework may not be worn into surgery.      Patients discharged the day of surgery will not be allowed to drive home. IF YOU ARE HAVING SURGERY AND GOING HOME THE SAME DAY, YOU MUST HAVE AN ADULT TO DRIVE YOU HOME AND BE WITH YOU FOR 24 HOURS. YOU MAY GO HOME BY TAXI OR UBER OR ORTHERWISE, BUT AN ADULT MUST ACCOMPANY YOU HOME AND STAY WITH YOU FOR 24 HOURS.    Name and phone number of your driver:  Husband:  Natalie Snyder #[phone]                 _____________________________________________________________________   How to Manage Your Diabetes Before and After Surgery  Why is it important to control my blood sugar before and after surgery? . Improving blood sugar levels before and after surgery helps healing and can limit problems. . A way of improving blood sugar control is eating a healthy diet by: o  Eating less sugar and  carbohydrates o  Increasing activity/exercise o  Talking with your doctor about reaching your blood sugar goals . High blood sugars (greater than 180 mg/dL) can raise your risk of infections and slow your recovery, so you will need to focus on controlling your diabetes during the weeks before surgery. . Make sure that the doctor who takes care of your diabetes knows about your planned surgery including the date and location.  How do I manage my blood sugar before surgery? . Check your blood sugar at least 4 times a day, starting 2 days before surgery, to make sure that the level is not too high or low. o Check your blood sugar the morning of your surgery when you wake up and every 2 hours until you get to the Short Stay unit. . If your blood sugar is less than 70 mg/dL, you will need to treat  for low blood sugar: o Do not take insulin. o Treat a low blood sugar (less than 70 mg/dL) with  cup of clear juice (cranberry or apple), 4 glucose tablets, OR glucose gel. o Recheck blood sugar in 15 minutes after treatment (to make sure it is greater than 70 mg/dL). If your blood sugar is not greater than 70 mg/dL on recheck, call [phone] for further instructions. . Report your blood sugar to the short stay nurse when you get to Short Stay.  . If you are admitted to the hospital after surgery: o Your blood sugar will be checked by the staff and you will probably be given insulin after surgery (instead of oral diabetes medicines) to make sure you have good blood sugar levels. o The goal for blood sugar control after surgery is 80-180 mg/dL.   WHAT DO I DO ABOUT MY DIABETES MEDICATION?  Marland Kitchen Do not take oral diabetes medicines (pills) the morning of surgery.  . THE NIGHT BEFORE SURGERY, take   70%  units of    [facility]- Regular 70/30   insulin.       . THE MORNING OF SURGERY, take  NO      insulin.  . The day of surgery, do not take other diabetes injectables, including Byetta (exenatide), Bydureon  (exenatide ER), Victoza (liraglutide), or Trulicity (dulaglutide).  . If your CBG is greater than 220 mg/dL, you may take  of your sliding scale  . (correction) dose of insulin      Patient Signature:  Date:   Nurse Signature:  Date:   Reviewed and Endorsed by [facility] Patient Education Committee, August 2015          [facility] - Preparing for Surgery Before surgery, you can play an important role.  Because skin is not sterile, your skin needs to be as free of germs as possible.  You can reduce the number of germs on your skin by washing with CHG (chlorahexidine gluconate) soap before surgery.  CHG is an antiseptic cleaner which kills germs and bonds with the skin to continue killing germs even after washing. Please DO NOT use if you have an allergy to CHG or antibacterial soaps.  If your skin becomes reddened/irritated stop using the CHG and inform your nurse when you arrive at Short Stay. Do not shave (including legs and underarms) for at least 48 hours prior to the first CHG shower.  You may shave your face/neck. Please follow these instructions carefully:  1.  Shower with CHG Soap the night before surgery and the  morning of Surgery.  2.  If you choose to wash your hair, wash your hair first as usual with your  normal  shampoo.  3.  After you shampoo, rinse your hair and body thoroughly to remove the  shampoo.                            4.  Use CHG as you would any other liquid soap.  You can apply chg directly  to the skin and wash                       Gently with a scrungie or clean washcloth.  5.  Apply the CHG Soap to your body ONLY FROM THE NECK DOWN.   Do not use on face/ open  Wound or open sores. Avoid contact with eyes, ears mouth and genitals (private parts).                       Wash face,  Genitals (private parts) with your normal soap.             6.  Wash thoroughly, paying special attention to the area where your surgery  will be  performed.  7.  Thoroughly rinse your body with warm water from the neck down.  8.  DO NOT shower/wash with your normal soap after using and rinsing off  the CHG Soap.             9.  Pat yourself dry with a clean towel.            10.  Wear clean pajamas.            11.  Place clean sheets on your bed the night of your first shower and do not  sleep with pets. Day of Surgery : Do not apply any lotions/deodorants the morning of surgery.  Please wear clean clothes to the hospital/surgery center.   ________________________________________________________________________   Incentive Spirometer  An incentive spirometer is a tool that can help keep your lungs clear and active. This tool measures how well you are filling your lungs with each breath. Taking long deep breaths may help reverse or decrease the chance of developing breathing (pulmonary) problems (especially infection) following:  A long period of time when you are unable to move or be active. BEFORE THE PROCEDURE   If the spirometer includes an indicator to show your best effort, your nurse or respiratory therapist will set it to a desired goal.  If possible, sit up straight or lean slightly forward. Try not to slouch.  Hold the incentive spirometer in an upright position. INSTRUCTIONS FOR USE  1. Sit on the edge of your bed if possible, or sit up as far as you can in bed or on a chair. 2. Hold the incentive spirometer in an upright position. 3. Breathe out normally. 4. Place the mouthpiece in your mouth and seal your lips tightly around it. 5. Breathe in slowly and as deeply as possible, raising the piston or the ball toward the top of the column. 6. Hold your breath for 3-5 seconds or for as long as possible. Allow the piston or ball to fall to the bottom of the column. 7. Remove the mouthpiece from your mouth and breathe out normally. 8. Rest for a few seconds and repeat Steps 1 through 7 at least 10 times every 1-2 hours  when you are awake. Take your time and take a few normal breaths between deep breaths. 9. The spirometer may include an indicator to show your best effort. Use the indicator as a goal to work toward during each repetition. 10. After each set of 10 deep breaths, practice coughing to be sure your lungs are clear. If you have an incision (the cut made at the time of surgery), support your incision when coughing by placing a pillow or rolled up towels firmly against it. Once you are able to get out of bed, walk around indoors and cough well. You may stop using the incentive spirometer when instructed by your caregiver.  RISKS AND COMPLICATIONS  Take your time so you do not get dizzy or light-headed.  If you are in pain, you may need to take or ask for pain medication before  doing incentive spirometry. It is harder to take a deep breath if you are having pain. AFTER USE  Rest and breathe slowly and easily.  It can be helpful to keep track of a log of your progress. Your caregiver can provide you with a simple table to help with this. If you are using the spirometer at home, follow these instructions: SEEK MEDICAL CARE IF:   You are having difficultly using the spirometer.  You have trouble using the spirometer as often as instructed.  Your pain medication is not giving enough relief while using the spirometer.  You develop fever of 100.5 F (38.1 C) or higher. SEEK IMMEDIATE MEDICAL CARE IF:   You cough up bloody sputum that had not been present before.  You develop fever of 102 F (38.9 C) or greater.  You develop worsening pain at or near the incision site. MAKE SURE YOU:   Understand these instructions.  Will watch your condition.  Will get help right away if you are not doing well or get worse. Document Released: 02/15/2007 Document Revised: 12/28/2011 Document Reviewed: 04/18/2007 [facility] Patient Information 2014 [place],  [place].   ________________________________________________________________________

## 2018-12-21 ENCOUNTER — Encounter (HOSPITAL_COMMUNITY): Payer: Self-pay

## 2018-12-21 ENCOUNTER — Other Ambulatory Visit: Payer: Self-pay

## 2018-12-21 ENCOUNTER — Encounter (HOSPITAL_COMMUNITY)
Admission: RE | Admit: 2018-12-21 | Discharge: 2018-12-21 | Disposition: A | Discharge status: Home / Self Care | Payer: BLUE CROSS/BLUE SHIELD | Source: Ambulatory Visit | Attending: Orthopedic Surgery | Admitting: Orthopedic Surgery

## 2018-12-21 DIAGNOSIS — Z01812 Encounter for preprocedural laboratory examination: Secondary | ICD-10-CM | POA: Diagnosis not present

## 2018-12-21 HISTORY — DX: Unspecified sequelae of cerebral infarction: I69.30

## 2018-12-21 HISTORY — DX: Nontoxic single thyroid nodule: E04.1

## 2018-12-21 HISTORY — DX: Type 2 diabetes mellitus without complications: E11.9

## 2018-12-21 HISTORY — DX: Long term (current) use of anticoagulants: Z79.01

## 2018-12-21 HISTORY — DX: Carpal tunnel syndrome, bilateral upper limbs: G56.03

## 2018-12-21 HISTORY — DX: Hypothyroidism, unspecified: E03.9

## 2018-12-21 LAB — BASIC METABOLIC PANEL
Anion gap: 11 (ref 5–15)
BUN: 18 mg/dL (ref 6–20)
CHLORIDE: 104 mmol/L (ref 98–111)
CO2: 25 mmol/L (ref 22–32)
CREATININE: 0.96 mg/dL (ref 0.44–1.00)
Calcium: 9.5 mg/dL (ref 8.9–10.3)
GFR calc non Af Amer: >60 mL/min (ref >60)
Glucose, Bld: 105 mg/dL — ABNORMAL HIGH (ref 70–99)
Potassium: 4 mmol/L (ref 3.5–5.1)
SODIUM: 140 mmol/L (ref 135–145)

## 2018-12-21 LAB — CBC
HCT: 39.2 % (ref 36.0–46.0)
Hemoglobin: 12.7 g/dL (ref 12.0–15.0)
MCH: 30 pg (ref 26.0–34.0)
MCHC: 32.4 g/dL (ref 30.0–36.0)
MCV: 92.7 fL (ref 80.0–100.0)
NRBC: 0 % (ref 0.0–0.2)
Platelets: 281 10*3/uL (ref 150–400)
RBC: 4.23 MIL/uL (ref 3.87–5.11)
RDW: 14.2 % (ref 11.5–15.5)
WBC: 11.8 10*3/uL — ABNORMAL HIGH (ref 4.0–10.5)

## 2018-12-21 LAB — GLUCOSE, CAPILLARY: Glucose-Capillary: 103 mg/dL — ABNORMAL HIGH (ref 70–99)

## 2018-12-21 NOTE — Progress Notes (Signed)
   12/21/18 1358  OBSTRUCTIVE SLEEP APNEA  Have you ever been diagnosed with sleep apnea through a sleep study? No  Do you snore loudly (loud enough to be heard through closed doors)?  1  Do you often feel tired, fatigued, or sleepy during the daytime (such as falling asleep during driving or talking to someone)? 0  Has anyone observed you stop breathing during your sleep? 1  Do you have, or are you being treated for high blood pressure? 1  BMI more than 35 kg/m2? 1  Age > 50 (1-yes) 1  Female Gender (Yes=1) 0  Obstructive Sleep Apnea Score 5  Score 5 or greater  Results sent to PCP

## 2018-12-22 NOTE — Progress Notes (Addendum)
Received confirmed EKG done on 11-04-2018 that was requested from EKG department since the confirmed EKG is not showing in epic,  Placed in chart.   A1c result dated 11-04-2018 in epic.  Per pt at PAT appointment 12-21-2018 was given clearance to stop Plavix by her pcp.

## 2018-12-22 NOTE — Progress Notes (Signed)
Anesthesia Chart Review   Case:  [id number] Date/Time:  12/23/18 3817   Procedure:  CARPAL TUNNEL RELEASE (Left )   Anesthesia type:  Local   Pre-op diagnosis:  LEFT CARPAL TUNNEL SYNDROME   Location:  [facility]   Surgeon:  Frederico Hamman, MD      DISCUSSION: 53 yo former smoker (10 pack years, quit 07/23/95) with h/o depression, DM II, HTN, GERD, hypothyroidism, h/o CVA 2017 (on Plavix and ASA), left carpal tunnel syndrome scheduled for above procedure 12/23/18 with Dr. Frederico Hamman.   H/o CVA 05/2016 and 08/2016.  She was last seen by neurology 12/08/16, Dr. Levert Feinstein.  At this visit per Dr. Zannie Cove note DM II was poorly controlled.  At this time DM II better controlled with normal labs at PST visit on 12/21/18.  She is followed by PCP, Dr. Donzetta Sprung, last seen 3 months ago, stable at this visit, no changes to medications made.  PCP manages Plavix, pt reports last dose Plavix and ASA 12/17/18.    Anesthesia records from 11/11/18 reviewed, no complications noted.   Pt is stable, can proceed with planned procedure barring acute status change.  VS: There were no vitals taken for this visit.  PROVIDERS: Richardean Chimera, MD is PCP   Levert Feinstein, MD is Neurologist  LABS: Labs reviewed: Acceptable for surgery. (all labs ordered are listed, but only abnormal results are displayed)  Labs Reviewed  BASIC METABOLIC PANEL - Abnormal; Notable for the following components:      Result Value   Glucose, Bld 105 (*)    All other components within normal limits  CBC - Abnormal; Notable for the following components:   WBC 11.8 (*)    All other components within normal limits  GLUCOSE, CAPILLARY - Abnormal; Notable for the following components:   Glucose-Capillary 103 (*)    All other components within normal limits     IMAGES:   EKG: 11/04/18 Rate 86 bpm Normal sinus rhythm  Right superior axis deviatio  Pulmonary disease pattern   CV: Echo 09/22/2016 Study Conclusions  -  Left ventricle: The cavity size was normal. Wall thickness was   increased in a pattern of moderate LVH. Systolic function was   normal. The estimated ejection fraction was in the range of 60%   to 65%. Wall motion was normal; there were no regional wall   motion abnormalities. Left ventricular diastolic function   parameters were normal. - Aortic valve: Mildly calcified annulus. Trileaflet. - Right atrium: Central venous pressure (est): 3 mm Hg. - Atrial septum: No defect or patent foramen ovale was identified. - Tricuspid valve: There was physiologic regurgitation. - Pulmonary arteries: Systolic pressure could not be accurately   estimated. - Pericardium, extracardiac: There was no pericardial effusion.  Impressions:  - Moderate LVH with LVEF 60-65% and grossly normal diastolic   function. Mildly calcified aortic annulus. No obvious PFO or ASD. Past Medical History:  Diagnosis Date  . Anticoagulant long-term use   . Carpal tunnel syndrome on both sides    s/p  right carpal tunnel release 11-11-2018  . Depression   . Herpes   . History of CVA with residual deficit 08/ 2017 left thalamic infarct;  12/ 2017 right posterior inferior pons (per imaging old left basal ganglia infarct /  followed by pcp ,  previous neurologist, dr Terrace Arabia   per pt residual right side numbness and a little numbness on left side , but is able to  walk with some instability , does not use cane/ walker  . Hypertension    followed by pcp  . Hypothyroidism    followed by pcp  . Thyroid nodule    incidental finding on imaging 2017  . Type 2 diabetes mellitus (HCC)    followed by pcp    Past Surgical History:  Procedure Laterality Date  . CARPAL TUNNEL RELEASE Right 11/11/2018   Procedure: CARPAL TUNNEL RELEASE;  Surgeon: Frederico Hamman, MD;  Location: [facility];  Service: Orthopedics;  Laterality: Right;  . CESAREAN SECTION      MEDICATIONS: . Dulaglutide (TRULICITY) 0.75 MG/0.5ML SOPN  . escitalopram  (LEXAPRO) 20 MG tablet  . acetaminophen (TYLENOL) 500 MG tablet  . amLODipine (NORVASC) 10 MG tablet  . aspirin EC 81 MG EC tablet  . atorvastatin (LIPITOR) 40 MG tablet  . clopidogrel (PLAVIX) 75 MG tablet  . gabapentin (NEURONTIN) 600 MG tablet  . HYDROcodone-acetaminophen (NORCO) 5-325 MG tablet  . insulin NPH-regular Human (70-30) 100 UNIT/ML injection  . levothyroxine (SYNTHROID, LEVOTHROID) 100 MCG tablet  . lisinopril-hydrochlorothiazide (PRINZIDE,ZESTORETIC) 20-12.5 MG tablet  . Melatonin 10 MG TABS  . metFORMIN (GLUCOPHAGE-XR) 500 MG 24 hr tablet  . metoprolol succinate (TOPROL-XL) 50 MG 24 hr tablet  . pantoprazole (PROTONIX) 40 MG tablet   No current facility-administered medications for this encounter.    Janey Genta [facility] [phone] 12/22/18 10:01 AM

## 2018-12-22 NOTE — Anesthesia Preprocedure Evaluation (Addendum)
Anesthesia Evaluation  Patient identified by MRN, date of birth, ID band Patient awake    Reviewed: Allergy & Precautions, NPO status , Patient's Chart, lab work & pertinent test results  Airway Mallampati: II  TM Distance: >3 FB Neck ROM: Full    Dental   Pulmonary former smoker,    Pulmonary exam normal        Cardiovascular hypertension, Pt. on medications Normal cardiovascular exam     Neuro/Psych Depression CVA, Residual Symptoms    GI/Hepatic   Endo/Other  diabetes, Type 2, Insulin Dependent, Oral Hypoglycemic Agents  Renal/GU      Musculoskeletal   Abdominal   Peds  Hematology   Anesthesia Other Findings   Reproductive/Obstetrics                            Anesthesia Physical Anesthesia Plan  ASA: III  Anesthesia Plan: Bier Block and Bier Block-LIDOCAINE ONLY   Post-op Pain Management:    Induction: Intravenous  PONV Risk Score and Plan: 2  Airway Management Planned: Simple Face Mask  Additional Equipment:   Intra-op Plan:   Post-operative Plan:   Informed Consent: I have reviewed the patients History and Physical, chart, labs and discussed the procedure including the risks, benefits and alternatives for the proposed anesthesia with the patient or authorized representative who has indicated his/her understanding and acceptance.       Plan Discussed with: CRNA and Surgeon  Anesthesia Plan Comments: (See PAT note 12/21/18, Jodell Cipro, PA-C)       Anesthesia Quick Evaluation

## 2018-12-23 ENCOUNTER — Encounter (HOSPITAL_COMMUNITY): Payer: Self-pay | Admitting: Certified Registered"

## 2018-12-23 ENCOUNTER — Ambulatory Visit (HOSPITAL_COMMUNITY)
Admission: RE | Admit: 2018-12-23 | Discharge: 2018-12-23 | Disposition: A | Discharge status: Home / Self Care | Payer: BLUE CROSS/BLUE SHIELD | Attending: Orthopedic Surgery | Admitting: Orthopedic Surgery

## 2018-12-23 ENCOUNTER — Other Ambulatory Visit: Payer: Self-pay

## 2018-12-23 ENCOUNTER — Encounter (HOSPITAL_COMMUNITY)
Admission: RE | Disposition: A | Discharge status: Home / Self Care | Payer: Self-pay | Source: Home / Self Care | Attending: Orthopedic Surgery

## 2018-12-23 ENCOUNTER — Ambulatory Visit (HOSPITAL_COMMUNITY): Payer: BLUE CROSS/BLUE SHIELD | Admitting: Certified Registered"

## 2018-12-23 ENCOUNTER — Ambulatory Visit (HOSPITAL_COMMUNITY): Payer: BLUE CROSS/BLUE SHIELD | Admitting: Physician Assistant

## 2018-12-23 DIAGNOSIS — I639 Cerebral infarction, unspecified: Secondary | ICD-10-CM | POA: Diagnosis not present

## 2018-12-23 DIAGNOSIS — Z8673 Personal history of transient ischemic attack (TIA), and cerebral infarction without residual deficits: Secondary | ICD-10-CM | POA: Diagnosis not present

## 2018-12-23 DIAGNOSIS — E119 Type 2 diabetes mellitus without complications: Secondary | ICD-10-CM | POA: Insufficient documentation

## 2018-12-23 DIAGNOSIS — Z87891 Personal history of nicotine dependence: Secondary | ICD-10-CM | POA: Insufficient documentation

## 2018-12-23 DIAGNOSIS — G5602 Carpal tunnel syndrome, left upper limb: Secondary | ICD-10-CM | POA: Diagnosis not present

## 2018-12-23 DIAGNOSIS — Z794 Long term (current) use of insulin: Secondary | ICD-10-CM | POA: Diagnosis not present

## 2018-12-23 DIAGNOSIS — I10 Essential (primary) hypertension: Secondary | ICD-10-CM | POA: Diagnosis not present

## 2018-12-23 DIAGNOSIS — M25532 Pain in left wrist: Secondary | ICD-10-CM | POA: Diagnosis not present

## 2018-12-23 HISTORY — PX: CARPAL TUNNEL RELEASE: SHX101

## 2018-12-23 LAB — GLUCOSE, CAPILLARY
Glucose-Capillary: 123 mg/dL — ABNORMAL HIGH (ref 70–99)
Glucose-Capillary: 115 mg/dL — ABNORMAL HIGH (ref 70–99)

## 2018-12-23 SURGERY — CARPAL TUNNEL RELEASE
Anesthesia: Regional | Laterality: Left

## 2018-12-23 MED ORDER — LACTATED RINGERS IV SOLN
INTRAVENOUS | Status: DC
  Administered 2018-12-23: 08:00:00 via INTRAVENOUS

## 2018-12-23 MED ORDER — LIDOCAINE HCL (PF) 0.5 % IJ SOLN
INTRAMUSCULAR | Status: AC
  Filled 2018-12-23: qty 50

## 2018-12-23 MED ORDER — CHLORHEXIDINE GLUCONATE 4 % EX LIQD: 60.0000 mL | Freq: Once | CUTANEOUS | Status: DC

## 2018-12-23 MED ORDER — LIDOCAINE HCL (PF) 0.5 % IJ SOLN
INTRAMUSCULAR | Status: DC | PRN
  Administered 2018-12-23: 50 mL via INTRAVENOUS

## 2018-12-23 MED ORDER — MIDAZOLAM HCL 2 MG/2ML IJ SOLN
INTRAMUSCULAR | Status: AC
  Filled 2018-12-23: qty 2

## 2018-12-23 MED ORDER — BUPIVACAINE HCL (PF) 0.25 % IJ SOLN
INTRAMUSCULAR | Status: DC | PRN
  Administered 2018-12-23: 10 mL

## 2018-12-23 MED ORDER — FENTANYL CITRATE (PF) 100 MCG/2ML IJ SOLN
INTRAMUSCULAR | Status: AC
  Filled 2018-12-23: qty 2

## 2018-12-23 MED ORDER — DEXTROSE 5 % IV SOLN
3.0000 g | INTRAVENOUS | Status: AC
  Administered 2018-12-23: 3 g via INTRAVENOUS
  Filled 2018-12-23: qty 3

## 2018-12-23 MED ORDER — PROPOFOL 500 MG/50ML IV EMUL
INTRAVENOUS | Status: DC | PRN
  Administered 2018-12-23: 100 ug/kg/min via INTRAVENOUS

## 2018-12-23 MED ORDER — ONDANSETRON HCL 4 MG/2ML IJ SOLN
INTRAMUSCULAR | Status: DC | PRN
  Administered 2018-12-23: 4 mg via INTRAVENOUS

## 2018-12-23 MED ORDER — MIDAZOLAM HCL 5 MG/5ML IJ SOLN
INTRAMUSCULAR | Status: DC | PRN
  Administered 2018-12-23: 2 mg via INTRAVENOUS

## 2018-12-23 MED ORDER — FENTANYL CITRATE (PF) 100 MCG/2ML IJ SOLN
INTRAMUSCULAR | Status: DC | PRN
  Administered 2018-12-23: 50 ug via INTRAVENOUS
  Administered 2018-12-23 (×2): 25 ug via INTRAVENOUS

## 2018-12-23 MED ORDER — SODIUM CHLORIDE 0.9 % IV SOLN: INTRAVENOUS | Status: DC

## 2018-12-23 MED ORDER — BUPIVACAINE HCL (PF) 0.25 % IJ SOLN
INTRAMUSCULAR | Status: AC
  Filled 2018-12-23: qty 30

## 2018-12-23 MED ORDER — SODIUM CHLORIDE 0.9 % IR SOLN
Status: DC | PRN
  Administered 2018-12-23: 1000 mL

## 2018-12-23 MED ORDER — HYDROCODONE-ACETAMINOPHEN 5-325 MG PO TABS: 1.0000 | ORAL_TABLET | ORAL | 0 refills | Status: AC | PRN

## 2018-12-23 SURGICAL SUPPLY — 32 items
BANDAGE ACE 3X5.8 VEL STRL LF (GAUZE/BANDAGES/DRESSINGS) ×3 IMPLANT
BNDG CMPR 9X4 STRL LF SNTH (GAUZE/BANDAGES/DRESSINGS)
BNDG COHESIVE 4X5 TAN STRL (GAUZE/BANDAGES/DRESSINGS) IMPLANT
BNDG CONFORM 3 STRL LF (GAUZE/BANDAGES/DRESSINGS) ×3 IMPLANT
BNDG ESMARK 4X9 LF (GAUZE/BANDAGES/DRESSINGS) IMPLANT
CORD BIPOLAR FORCEPS 12FT (ELECTRODE) ×3 IMPLANT
COVER SURGICAL LIGHT HANDLE (MISCELLANEOUS) ×3 IMPLANT
COVER WAND RF STERILE (DRAPES) IMPLANT
CUFF TOURN SGL QUICK 18X4 (TOURNIQUET CUFF) ×3 IMPLANT
CUFF TOURN SGL QUICK 24 (TOURNIQUET CUFF)
CUFF TRNQT CYL 24X4X16.5-23 (TOURNIQUET CUFF) IMPLANT
DRAPE SURG 17X11 SM STRL (DRAPES) ×3 IMPLANT
DRSG EMULSION OIL 3X3 NADH (GAUZE/BANDAGES/DRESSINGS) ×3 IMPLANT
DURAPREP 26ML APPLICATOR (WOUND CARE) IMPLANT
GAUZE SPONGE 4X4 12PLY STRL (GAUZE/BANDAGES/DRESSINGS) ×3 IMPLANT
GLOVE BIOGEL PI IND STRL 8 (GLOVE) ×2 IMPLANT
GLOVE BIOGEL PI INDICATOR 8 (GLOVE) ×4
GLOVE SURG ORTHO 8.0 STRL STRW (GLOVE) ×3 IMPLANT
GLOVE SURG SS PI 7.5 STRL IVOR (GLOVE) ×3 IMPLANT
GOWN STRL REUS W/TWL XL LVL3 (GOWN DISPOSABLE) ×6 IMPLANT
KIT BASIN OR (CUSTOM PROCEDURE TRAY) ×3 IMPLANT
MANIFOLD NEPTUNE II (INSTRUMENTS) IMPLANT
NEEDLE HYPO 22GX1.5 SAFETY (NEEDLE) ×3 IMPLANT
NS IRRIG 1000ML POUR BTL (IV SOLUTION) ×3 IMPLANT
PACK ORTHO EXTREMITY (CUSTOM PROCEDURE TRAY) ×3 IMPLANT
PAD CAST 3X4 CTTN HI CHSV (CAST SUPPLIES) IMPLANT
PADDING CAST COTTON 3X4 STRL (CAST SUPPLIES)
PROTECTOR NERVE ULNAR (MISCELLANEOUS) ×3 IMPLANT
SUT ETHILON 3 0 PS 1 (SUTURE) IMPLANT
SUT ETHILON 4 0 PS 2 18 (SUTURE) ×3 IMPLANT
SYR CONTROL 10ML LL (SYRINGE) ×3 IMPLANT
TOWEL OR 17X26 10 PK STRL BLUE (TOWEL DISPOSABLE) ×6 IMPLANT

## 2018-12-23 NOTE — Anesthesia Postprocedure Evaluation (Signed)
Anesthesia Post Note  Patient: Natalie Snyder  Procedure(s) Performed: CARPAL TUNNEL RELEASE (Left )     Patient location during evaluation: PACU Anesthesia Type: Bier Block Level of consciousness: awake and alert and patient cooperative Pain management: pain level controlled Vital Signs Assessment: post-procedure vital signs reviewed and stable Respiratory status: spontaneous breathing and respiratory function stable Cardiovascular status: stable Anesthetic complications: no    Last Vitals:  Vitals:   12/23/18 0930 12/23/18 0950  BP: 116/77 118/72  Pulse: 73 82  Resp: 16 17  Temp: 36.6 C 36.4 C  SpO2: 97% 98%    Last Pain:  Vitals:   12/23/18 0950  TempSrc:   PainSc: 0-No pain                 Abishai Viegas DAVID

## 2018-12-23 NOTE — Interval H&P Note (Signed)
History and Physical Interval Note:  12/23/2018 7:40 AM  Natalie Snyder  has presented today for surgery, with the diagnosis of LEFT CARPAL TUNNEL SYNDROME  The various methods of treatment have been discussed with the patient and family. After consideration of risks, benefits and other options for treatment, the patient has consented to  Procedure(s): CARPAL TUNNEL RELEASE (Left) as a surgical intervention .  The patient's history has been reviewed, patient examined, no change in status, stable for surgery.  I have reviewed the patient's chart and labs.  Questions were answered to the patient's satisfaction.     Thera Flake

## 2018-12-23 NOTE — Brief Op Note (Signed)
12/23/2018  9:00 AM  PATIENT:  Natalie Snyder  53 y.o. female  PRE-OPERATIVE DIAGNOSIS:  LEFT CARPAL TUNNEL SYNDROME  POST-OPERATIVE DIAGNOSIS:  S/PLEFT CARPAL TUNNEL RELEASE  PROCEDURE:  Procedure(s): CARPAL TUNNEL RELEASE (Left)  SURGEON:  Surgeon(s) and Role:    Frederico Hamman, MD - Primary  PHYSICIAN ASSISTANT: Margart Sickles, PA-C  ASSISTANTS:    ANESTHESIA:   local and Bier block  EBL:  minimal   BLOOD ADMINISTERED:none  DRAINS: none   LOCAL MEDICATIONS USED:  MARCAINE    and LIDOCAINE   SPECIMEN:  No Specimen  DISPOSITION OF SPECIMEN:  N/A  COUNTS:  YES  TOURNIQUET:   Total Tourniquet Time Documented: Upper Arm (Left) - 30 minutes Total: Upper Arm (Left) - 30 minutes   DICTATION: .Other Dictation: Dictation Number unknown  PLAN OF CARE: Discharge to home after PACU  PATIENT DISPOSITION:  PACU - hemodynamically stable.   Delay start of Pharmacological VTE agent (>24hrs) due to surgical blood loss or risk of bleeding: not applicable

## 2018-12-23 NOTE — Op Note (Signed)
NAME: Natalie Snyder, Natalie Snyder MEDICAL RECORD ON:[id number] ACCOUNT [id number] DATE OF BIRTH:1966/01/24 FACILITY: [facility] LOCATION: [facility] PHYSICIAN:W. Trexton Escamilla JR., MD  OPERATIVE REPORT  DATE OF PROCEDURE:  12/23/2018  PREOPERATIVE DIAGNOSIS:  Left carpal tunnel.  POSTOPERATIVE DIAGNOSIS:  Left carpal tunnel.  OPERATION:  Left carpal tunnel release.  SURGEON:  Marcie Mowers, MD  ASSISTANTVincent Peyer.  TOURNIQUET TIME:  25 minutes.  ANESTHESIA:  IV Xylocaine.  DESCRIPTION OF PROCEDURE:  After IV Xylocaine, a curvilinear incision was made over the thenar crease just ulnar.  Dissection was carried down through the palmar fascia through the transverse carpal ligament.  Complete release of the transverse carpal  ligament was done.  We dissected distally to the level of superficial arch proximally to include the antebrachial fascia of the wrist.  Complete release of these structures was carried out.  The nerve showed moderate compression.  No other anomalies were  noted.  Wound was irrigated, closed in interrupted nylon, infiltrated with 10 mL 0.25% Marcaine.  Tourniquet was released after application of the dressing.  TN/NUANCE  D:12/23/2018 T:12/23/2018 JOB:[id number]

## 2018-12-23 NOTE — Transfer of Care (Signed)
Immediate Anesthesia Transfer of Care Note  Patient: Natalie Snyder  Procedure(s) Performed: CARPAL TUNNEL RELEASE (Left )  Patient Location: PACU  Anesthesia Type:MAC and Regional  Level of Consciousness: awake, alert  and oriented  Airway & Oxygen Therapy: Patient Spontanous Breathing and Patient connected to face mask oxygen  Post-op Assessment: Report given to RN and Post -op Vital signs reviewed and stable  Post vital signs: Reviewed and stable  Last Vitals:  Vitals Value Taken Time  BP    Temp    Pulse 79 12/23/2018  9:07 AM  Resp 18 12/23/2018  9:07 AM  SpO2 100 % 12/23/2018  9:07 AM  Vitals shown include unvalidated device data.  Last Pain:  Vitals:   12/23/18 0802  TempSrc:   PainSc: 0-No pain         Complications: No apparent anesthesia complications

## 2018-12-23 NOTE — Discharge Instructions (Signed)
Diet: As you were doing prior to hospitalization   Activity: Increase activity slowly as tolerated  No lifting or driving for 24 hours  Shower: May shower without a dressing on post op day #3, NO SOAKING OR SUBMERGING HAND.  Dressing: You may change your dressing on post op day #3.  Then change the dressing daily with sterile 4"x4"s gauze dressing   Weight Bearing: no lifting with operative hand.  To prevent constipation: you may use a stool softener such as -  Colace ( over the counter) 100 mg by mouth twice a day  Drink plenty of fluids ( prune juice may be helpful) and high fiber foods  Miralax ( over the counter) for constipation as needed.   Precautions: If you experience chest pain or shortness of breath - call 911 immediately For transfer to the hospital emergency department!!  If you develop a fever greater that 101 F, purulent drainage from wound, increased redness or drainage from wound, or calf pain -- Call the office   Follow- Up Appointment: Please call for an appointment to be seen in 2 weeks  [place] - [phone]   General Anesthesia, Adult, Care After This sheet gives you information about how to care for yourself after your procedure. Your health care provider may also give you more specific instructions. If you have problems or questions, contact your health care provider. What can I expect after the procedure? After the procedure, the following side effects are common:  Pain or discomfort at the IV site.  Nausea.  Vomiting.  Sore throat.  Trouble concentrating.  Feeling cold or chills.  Weak or tired.  Sleepiness and fatigue.  Soreness and body aches. These side effects can affect parts of the body that were not involved in surgery. Follow these instructions at home:  For at least 24 hours after the procedure:  Have a responsible adult stay with you. It is important to have someone help care for you until you are awake and alert.  Rest as  needed.  Do not: ? Participate in activities in which you could fall or become injured. ? Drive. ? Use heavy machinery. ? Drink alcohol. ? Take sleeping pills or medicines that cause drowsiness. ? Make important decisions or sign legal documents. ? Take care of children on your own. Eating and drinking  Follow any instructions from your health care provider about eating or drinking restrictions.  When you feel hungry, start by eating small amounts of foods that are soft and easy to digest (bland), such as toast. Gradually return to your regular diet.  Drink enough fluid to keep your urine pale yellow.  If you vomit, rehydrate by drinking water, juice, or clear broth. General instructions  If you have sleep apnea, surgery and certain medicines can increase your risk for breathing problems. Follow instructions from your health care provider about wearing your sleep device: ? Anytime you are sleeping, including during daytime naps. ? While taking prescription pain medicines, sleeping medicines, or medicines that make you drowsy.  Return to your normal activities as told by your health care provider. Ask your health care provider what activities are safe for you.  Take over-the-counter and prescription medicines only as told by your health care provider.  If you smoke, do not smoke without supervision.  Keep all follow-up visits as told by your health care provider. This is important. Contact a health care provider if:  You have nausea or vomiting that does not get better with medicine.  You cannot eat or drink without vomiting.  You have pain that does not get better with medicine.  You are unable to pass urine.  You develop a skin rash.  You have a fever.  You have redness around your IV site that gets worse. Get help right away if:  You have difficulty breathing.  You have chest pain.  You have blood in your urine or stool, or you vomit blood. Summary  After the  procedure, it is common to have a sore throat or nausea. It is also common to feel tired.  Have a responsible adult stay with you for the first 24 hours after general anesthesia. It is important to have someone help care for you until you are awake and alert.  When you feel hungry, start by eating small amounts of foods that are soft and easy to digest (bland), such as toast. Gradually return to your regular diet.  Drink enough fluid to keep your urine pale yellow.  Return to your normal activities as told by your health care provider. Ask your health care provider what activities are safe for you. This information is not intended to replace advice given to you by your health care provider. Make sure you discuss any questions you have with your health care provider. Document Released: 01/11/2001 Document Revised: 05/21/2017 Document Reviewed: 05/21/2017 Elsevier Interactive Patient Education  2019 [employer].

## 2018-12-23 NOTE — Interval H&P Note (Signed)
History and Physical Interval Note:  12/23/2018 7:39 AM  Natalie Snyder  has presented today for surgery, with the diagnosis of LEFT CARPAL TUNNEL SYNDROME  The various methods of treatment have been discussed with the patient and family. After consideration of risks, benefits and other options for treatment, the patient has consented to  Procedure(s): CARPAL TUNNEL RELEASE (Left) as a surgical intervention .  The patient's history has been reviewed, patient examined, no change in status, stable for surgery.  I have reviewed the patient's chart and labs.  Questions were answered to the patient's satisfaction.     Thera Flake

## 2018-12-26 ENCOUNTER — Encounter (HOSPITAL_COMMUNITY): Payer: Self-pay | Admitting: Orthopedic Surgery

## 2019-01-03 DIAGNOSIS — G5603 Carpal tunnel syndrome, bilateral upper limbs: Secondary | ICD-10-CM | POA: Diagnosis not present

## 2019-01-31 DIAGNOSIS — G5603 Carpal tunnel syndrome, bilateral upper limbs: Secondary | ICD-10-CM | POA: Diagnosis not present

## 2019-03-30 DIAGNOSIS — G6289 Other specified polyneuropathies: Secondary | ICD-10-CM | POA: Diagnosis not present

## 2019-03-30 DIAGNOSIS — E1142 Type 2 diabetes mellitus with diabetic polyneuropathy: Secondary | ICD-10-CM | POA: Diagnosis not present

## 2019-04-05 DIAGNOSIS — Z0001 Encounter for general adult medical examination with abnormal findings: Secondary | ICD-10-CM | POA: Diagnosis not present

## 2019-04-10 DIAGNOSIS — I10 Essential (primary) hypertension: Secondary | ICD-10-CM | POA: Diagnosis not present

## 2019-04-10 DIAGNOSIS — I63312 Cerebral infarction due to thrombosis of left middle cerebral artery: Secondary | ICD-10-CM | POA: Diagnosis not present

## 2019-04-10 DIAGNOSIS — E1165 Type 2 diabetes mellitus with hyperglycemia: Secondary | ICD-10-CM | POA: Diagnosis not present

## 2019-04-10 DIAGNOSIS — E782 Mixed hyperlipidemia: Secondary | ICD-10-CM | POA: Diagnosis not present

## 2019-04-10 DIAGNOSIS — Z23 Encounter for immunization: Secondary | ICD-10-CM | POA: Diagnosis not present

## 2019-04-10 DIAGNOSIS — Z0001 Encounter for general adult medical examination with abnormal findings: Secondary | ICD-10-CM | POA: Diagnosis not present

## 2019-05-29 ENCOUNTER — Encounter (HOSPITAL_COMMUNITY): Payer: Self-pay | Admitting: Emergency Medicine

## 2019-05-29 ENCOUNTER — Other Ambulatory Visit: Payer: Self-pay

## 2019-05-29 ENCOUNTER — Emergency Department (HOSPITAL_COMMUNITY)
Admission: EM | Admit: 2019-05-29 | Discharge: 2019-05-29 | Disposition: A | Discharge status: Home / Self Care | Payer: BC Managed Care – PPO | Attending: Emergency Medicine | Admitting: Emergency Medicine

## 2019-05-29 DIAGNOSIS — I10 Essential (primary) hypertension: Secondary | ICD-10-CM | POA: Diagnosis not present

## 2019-05-29 DIAGNOSIS — E119 Type 2 diabetes mellitus without complications: Secondary | ICD-10-CM | POA: Insufficient documentation

## 2019-05-29 DIAGNOSIS — Z87891 Personal history of nicotine dependence: Secondary | ICD-10-CM | POA: Diagnosis not present

## 2019-05-29 DIAGNOSIS — Z7982 Long term (current) use of aspirin: Secondary | ICD-10-CM | POA: Insufficient documentation

## 2019-05-29 DIAGNOSIS — E861 Hypovolemia: Secondary | ICD-10-CM | POA: Insufficient documentation

## 2019-05-29 DIAGNOSIS — I9589 Other hypotension: Secondary | ICD-10-CM | POA: Insufficient documentation

## 2019-05-29 DIAGNOSIS — R42 Dizziness and giddiness: Secondary | ICD-10-CM | POA: Diagnosis not present

## 2019-05-29 DIAGNOSIS — Z79899 Other long term (current) drug therapy: Secondary | ICD-10-CM | POA: Diagnosis not present

## 2019-05-29 LAB — CBC WITH DIFFERENTIAL/PLATELET
Abs Immature Granulocytes: 0.02 10*3/uL (ref 0.00–0.07)
Basophils Absolute: 0 10*3/uL (ref 0.0–0.1)
Basophils Relative: 0 %
Eosinophils Absolute: 0.3 10*3/uL (ref 0.0–0.5)
Eosinophils Relative: 4 %
HCT: 36.4 % (ref 36.0–46.0)
Hemoglobin: 12.1 g/dL (ref 12.0–15.0)
Immature Granulocytes: 0 %
Lymphocytes Relative: 15 %
Lymphs Abs: 1.2 10*3/uL (ref 0.7–4.0)
MCH: 29.8 pg (ref 26.0–34.0)
MCHC: 33.2 g/dL (ref 30.0–36.0)
MCV: 89.7 fL (ref 80.0–100.0)
Monocytes Absolute: 0.7 10*3/uL (ref 0.1–1.0)
Monocytes Relative: 9 %
Neutro Abs: 5.9 10*3/uL (ref 1.7–7.7)
Neutrophils Relative %: 72 %
Platelets: 275 10*3/uL (ref 150–400)
RBC: 4.06 MIL/uL (ref 3.87–5.11)
RDW: 14.2 % (ref 11.5–15.5)
WBC: 8.2 10*3/uL (ref 4.0–10.5)
nRBC: 0 % (ref 0.0–0.2)

## 2019-05-29 LAB — COMPREHENSIVE METABOLIC PANEL
ALT: 25 U/L (ref 0–44)
AST: 19 U/L (ref 15–41)
Albumin: 4.1 g/dL (ref 3.5–5.0)
Alkaline Phosphatase: 62 U/L (ref 38–126)
Anion gap: 9 (ref 5–15)
BUN: 18 mg/dL (ref 6–20)
CO2: 28 mmol/L (ref 22–32)
Calcium: 9.2 mg/dL (ref 8.9–10.3)
Chloride: 98 mmol/L (ref 98–111)
Creatinine, Ser: 1.01 mg/dL — ABNORMAL HIGH (ref 0.44–1.00)
GFR calc Af Amer: >60 mL/min (ref >60)
GFR calc non Af Amer: >60 mL/min (ref >60)
Glucose, Bld: 106 mg/dL — ABNORMAL HIGH (ref 70–99)
Potassium: 4.2 mmol/L (ref 3.5–5.1)
Sodium: 135 mmol/L (ref 135–145)
Total Bilirubin: 0.4 mg/dL (ref 0.3–1.2)
Total Protein: 7.4 g/dL (ref 6.5–8.1)

## 2019-05-29 MED ORDER — SODIUM CHLORIDE 0.9 % IV BOLUS
1000.0000 mL | Freq: Once | INTRAVENOUS | Status: AC
  Administered 2019-05-29: 20:00:00 1000 mL via INTRAVENOUS

## 2019-05-29 MED ORDER — SODIUM CHLORIDE 0.9 % IV BOLUS
1000.0000 mL | Freq: Once | INTRAVENOUS | Status: AC
  Administered 2019-05-29: 1000 mL via INTRAVENOUS

## 2019-05-29 MED ORDER — MECLIZINE HCL 25 MG PO TABS: 25.0000 mg | ORAL_TABLET | Freq: Three times a day (TID) | ORAL | 0 refills | Status: AC | PRN

## 2019-05-29 MED ORDER — MECLIZINE HCL 12.5 MG PO TABS
25.0000 mg | ORAL_TABLET | Freq: Once | ORAL | Status: AC
  Administered 2019-05-29: 25 mg via ORAL
  Filled 2019-05-29: qty 2

## 2019-05-29 NOTE — Discharge Instructions (Signed)
Medications as prescribed.  Return for any new worsening symptoms.  Specifically if you develop sudden onset headache, one-sided weakness, numbness or tingling.

## 2019-05-29 NOTE — ED Triage Notes (Signed)
Patient reports dizziness, fatigue, and nausea that 'started a few days ago." Patient states she contacted her PCP and was sent here for possible dehydration.

## 2019-05-29 NOTE — ED Provider Notes (Signed)
[facility] EMERGENCY DEPARTMENT Provider Note   CSN: [id number] Arrival date & time: 05/29/19  1740  History   Chief Complaint Chief Complaint  Patient presents with  . Dizziness   HPI Natalie Snyder is a 53 y.o. female with past medical history significant for CVA, hypertension, hypothyroidism, diabetes who presents for evaluation of dizziness. Patient with dizziness and fatigue which began 4 days ago.  Called her PCP and was told this could possibly due to dehydration.  Was sent to emergency room for fluids.  Patient denies headache, slurred speech, unilateral weakness, chest pain, shortness of breath, neck pain, neck stiffness, abdominal pain, diarrhea, dysuria.  Patient states she does have residual deficits from her previous stroke however none currently.  She denies paresthesias or weakness or headache.  Describes dizziness as if the room is "spinning."  Worse when she goes to stand or roll over in bed.  Has had nausea without emesis.  Unsure prior history of vertigo.  Has not taken anything for her symptoms.  Denies additional aggravating or alleviating factors.  Patient obtained from patient, family and past medical records.  No interpreter was used.     HPI  Past Medical History:  Diagnosis Date  . Anticoagulant long-term use   . Carpal tunnel syndrome on both sides    s/p  right carpal tunnel release 11-11-2018  . Depression   . Herpes   . 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 Hypertension    followed by pcp  . Hypothyroidism    followed by pcp  . Thyroid nodule    incidental finding on imaging 2017  . Type 2 diabetes mellitus (HCC)    followed by pcp     Patient Active Problem List   Diagnosis Date Noted  . Stroke (cerebrum) (HCC) 09/21/2016  . Diabetes mellitus type 2, uncontrolled, with complications (HCC) 09/21/2016  . Thyroid nodule 05/23/2016  . CVA (cerebral infarction) 05/22/2016  . HTN (hypertension) 05/22/2016  . Obesity (BMI 30-39.9) 05/22/2016  . Acute CVA (cerebrovascular accident) (HCC) 05/22/2016    Past Surgical History:  Procedure Laterality Date  . CARPAL TUNNEL RELEASE Right 11/11/2018   Procedure: CARPAL TUNNEL RELEASE;  Surgeon: Frederico Hammanaffrey, Daniel, MD;  Location: [facility];  Service: Orthopedics;  Laterality: Right;  . CARPAL TUNNEL RELEASE Left 12/23/2018   Procedure: CARPAL TUNNEL RELEASE;  Surgeon: Frederico Hammanaffrey, Daniel, MD;  Location: [facility];  Service: Orthopedics;  Laterality: Left;  . CESAREAN SECTION       OB History    Gravida      Para      Term      Preterm      AB      Living  1     SAB      TAB      Ectopic      Multiple      Live Births               Home Medications    Prior to Admission medications   Medication Sig Start Date End Date Taking? Authorizing Provider  acetaminophen (TYLENOL) 500 MG tablet Take 500 mg by  mouth every 6 (six) hours as needed for moderate pain.    [provider]  amLODipine (NORVASC) 10 MG tablet Take 10 mg by mouth daily.  09/09/16   [provider]  aspirin EC 81 MG EC tablet Take 1 tablet (81 mg total) by mouth daily. 09/24/16   Maxie BarbBhandari, Dron Prasad, MD  atorvastatin (LIPITOR) 40 MG tablet Take 1 tablet (40 mg total) by mouth daily at 6 PM. 05/23/16   Houston SirenLe, Peter, MD  clopidogrel (PLAVIX) 75 MG tablet Take 1 tablet (75 mg total) by mouth daily. 05/23/16   Houston SirenLe, Peter, MD  Dulaglutide (TRULICITY) 0.75 MG/0.5ML SOPN Inject 0.75 mg into the skin every 7 (seven) days. Wednesday's    [provider]  escitalopram (LEXAPRO) 20 MG tablet Take 20 mg by mouth daily.    [provider]  gabapentin (NEURONTIN) 600 MG tablet Take 600 mg by  mouth 3 (three) times daily.    [provider]  HYDROcodone-acetaminophen (NORCO) 5-325 MG tablet Take 1 tablet by mouth every 4 (four) hours as needed for moderate pain. 12/23/18   Chadwell, Ivin BootyJoshua, PA-C  insulin NPH-regular Human (70-30) 100 UNIT/ML injection Inject 10-80 Units into the skin 2 (two) times daily with a meal.     [provider]  levothyroxine (SYNTHROID, LEVOTHROID) 100 MCG tablet Take 100 mcg by mouth daily before breakfast.    [provider]  lisinopril-hydrochlorothiazide (PRINZIDE,ZESTORETIC) 20-12.5 MG tablet Take 2 tablets by mouth daily.  11/16/16   [provider]  meclizine (ANTIVERT) 25 MG tablet Take 1 tablet (25 mg total) by mouth 3 (three) times daily as needed for dizziness. 05/29/19   Meline Russaw A, PA-C  Melatonin 10 MG TABS Take 20 mg by mouth at bedtime.    [provider]  metFORMIN (GLUCOPHAGE-XR) 500 MG 24 hr tablet Take 1,000 mg by mouth 2 (two) times daily with a meal.  07/27/16   [provider]  metoprolol succinate (TOPROL-XL) 50 MG 24 hr tablet Take 50 mg by mouth every evening.  08/24/16   [provider]  pantoprazole (PROTONIX) 40 MG tablet Take 1 tablet (40 mg total) by mouth daily. Patient taking differently: Take 40 mg by mouth 2 (two) times daily.  09/24/16   Maxie BarbBhandari, Dron Prasad, MD    Family History Family History  Problem Relation Age of Onset  . Pulmonary embolism Mother [id number]  . Aneurysm Mother   . CAD Father        5 MIs, first while in 8070s  . Parkinson's disease Father   . Hypertension Father   . Stroke Maternal Grandmother     Social History Social History   Tobacco Use  . Smoking status: Former Smoker    Packs/day: 1.00    Years: 10.00    Pack years: 10.00    Types: Cigarettes    Quit date: 07/23/1995    Years since quitting: 23.8  . Smokeless tobacco: Never Used  Substance Use Topics  . Alcohol use: No  . Drug use: Never     Allergies   Latex    Review of Systems Review of Systems  Constitutional: Negative.   HENT: Negative.   Eyes: Negative.   Respiratory: Negative.   Cardiovascular: Negative.   Gastrointestinal: Negative.   Musculoskeletal: Negative.   Skin: Negative.   Neurological: Positive for dizziness and light-headedness. Negative for tremors, seizures, syncope, facial asymmetry, speech difficulty, weakness, numbness and headaches.  All other systems reviewed and are negative.  Physical Exam Updated Vital Signs BP 126/81   Pulse 88   Temp 98.1 F (36.7 C) (Oral)   Resp 16   Ht 5\' 4"  (1.626 m)   Wt 131.1 kg   SpO2 96%   BMI 49.61 kg/m   Physical Exam Vitals signs and nursing note reviewed.  Constitutional:      General: She is not in acute distress.    Appearance: She is not ill-appearing, toxic-appearing or diaphoretic.  HENT:     Head: Normocephalic and atraumatic.     Jaw: There is normal jaw occlusion.     Nose:     Comments: No rhinorrhea and congestion to bilateral nares.  No sinus tenderness.    Mouth/Throat:     Comments: Posterior oropharynx clear.  Mucous membranes moist.  Tonsils without erythema or exudate.  Uvula midline without deviation.  No evidence of PTA or RPA.  No drooling, dysphasia or trismus.  Phonation normal. Eyes:     Comments: No horizontal, vertical or rotational nystagmus   Neck:     Trachea: Trachea and phonation normal.     Meningeal: Brudzinski's sign and Kernig's sign absent.     Comments: No Neck stiffness or neck rigidity.  No meningismus.  No cervical lymphadenopathy. Cardiovascular:     Comments: No murmurs rubs or gallops. Pulmonary:     Comments: Clear to auscultation bilaterally without wheeze, rhonchi or rales.  No accessory muscle usage.  Able speak in full sentences. Abdominal:     Comments: Soft, nontender without rebound or guarding.  No CVA tenderness.  Musculoskeletal:     Comments: Moves all 4 extremities without difficulty.  Lower extremities  without edema, erythema or warmth.  Skin:    Comments: Brisk capillary refill.  No rashes or lesions.  Neurological:     Mental Status: She is alert.     Comments: Mental Status:  Alert, oriented, thought content appropriate. Speech fluent without evidence of aphasia. Able to follow 2 step commands without difficulty.  Cranial Nerves:  II:  Peripheral visual fields grossly normal, pupils equal, round, reactive to light III,IV, VI: ptosis not present, extra-ocular motions intact bilaterally  V,VII: smile symmetric, facial light touch sensation equal VIII: hearing grossly normal bilaterally  IX,X: midline uvula rise  XI: bilateral shoulder shrug equal and strong XII: midline tongue extension  Motor:  5/5 in upper and lower extremities bilaterally including strong and equal grip strength and dorsiflexion/plantar flexion Sensory: Pinprick and light touch normal in all extremities.  Deep Tendon Reflexes: 2+ and symmetric  Cerebellar: normal finger-to-nose with bilateral upper extremities Gait: normal gait and balance CV: distal pulses palpable throughout     ED Treatments / Results  Labs (all labs ordered are listed, but only abnormal results are displayed) Labs Reviewed  COMPREHENSIVE METABOLIC PANEL - Abnormal; Notable for the following components:      Result Value   Glucose, Bld 106 (*)    Creatinine, Ser 1.01 (*)    All other components within normal limits  CBC WITH DIFFERENTIAL/PLATELET  URINALYSIS, ROUTINE W REFLEX MICROSCOPIC    EKG EKG Interpretation  Date/Time:  Monday May 29 2019 18:29:26 EDT Ventricular Rate:  90 PR Interval:    QRS Duration: 86 QT Interval:  350 QTC Calculation: 429 R Axis:   -11 Text Interpretation:  Sinus rhythm Low voltage, precordial leads Anteroseptal infarct, old Confirmed by Raeford RazorKohut, Stephen [phone]([id number]) on 05/29/2019 6:50:25 PM   Radiology No results found.  Procedures Procedures (including critical care time)  Medications Ordered  in ED Medications  sodium chloride 0.9 % bolus 1,000 mL (1,000 mLs Intravenous New Bag/Given 05/29/19 1838)  meclizine (ANTIVERT) tablet 25 mg (25 mg Oral Given 05/29/19 1822)  sodium chloride 0.9 % bolus 1,000 mL (1,000 mLs Intravenous New Bag/Given 05/29/19 1939)   Initial Impression / Assessment and Plan / ED Course  I have reviewed the triage vital signs and the nursing notes.  Pertinent labs & imaging results that were available during my care of the patient were reviewed by me and considered in my medical decision making (see chart for details).  53 year old female appears otherwise well presents for evaluation of dizziness which she describes as room spinning.  Worse with ambulating and rolling over in bed.  Has also had some nausea.  History of CVA however no headache, lateral weakness, paresthesias.  Patient does not want imaging at this time.  Discussed risk versus benefit to include new CVA.  She voices understanding and declines imaging at this time.  She has a nonfocal neurologic exam without neurologic deficits.  No chest pain or shortness of breath to suggest ACS or PE or dissection.  No neck stiffness or neck rigidity.  No meningismus.  Heart and lungs clear.  Does have a soft blood pressure on initial evaluation.  Will obtain EKG, provide fluids, labs meclizine and reevaluate.  No dizziness when lying still.  Only with ambulation and movement.  Patient with orthostatic VS.  Received IV fluids.  After 1 L of fluids blood pressure increased significantly to systolic 644/03.  Patient states she feels "so much better."  Continue nonfocal neurologic exam without neurologic deficits. No current dizziness or lighheadedness.  Ambulatory to restroom without difficulty.  Unable to obtain urine sample at this time.  On reevaluation patient with continued stable vital signs.  She denies any current symptoms.  Labs personally reviewed CBC without leukocytosis, globin 47.4 Metabolic panel with  mildly elevated glucose at 106, creatinine 1.01 at baseline Urinalysis unable to be collected.   EKG without ST/T changes.  No STEMI.  Discussed with patient would like to clean urinalysis however she declines any symptoms.  Patient states she would like DC home at this time.  Does not want to wait until she can provide additional urine sample.  Given she has no symptoms at this time feel this is necessary.  Discussed her return precautions.  Patient to follow-up with PCP she develops any urinary symptoms.  Patient with negative skew test and normal neurologic exam.  No vertical or rotational nystagmus. Patient normal finger-nose and normal gait.  No slurred speech renal or weakness.  Doubt CVA or other central cause of vertigo.  History and physical consistent with peripheral vertigo symptoms.  We'll discharge home with meclizine. Patient instructed to followup with her primary care physician or neurology within 3 days for further evaluation. They are to return to the emergency department for new neurologic symptoms, loss of vision or other concerning symptoms.       Final Clinical Impressions(s) / ED Diagnoses   Final diagnoses:  Dizziness  Hypotension due to hypovolemia    ED Discharge Orders         Ordered    meclizine (ANTIVERT) 25 MG tablet  3 times daily PRN     05/29/19 2118           Skye Rodarte A, PA-C 05/29/19 2131    Virgel Manifold, MD 06/01/19 0945

## 2019-06-01 DIAGNOSIS — I951 Orthostatic hypotension: Secondary | ICD-10-CM | POA: Diagnosis not present

## 2019-08-08 DIAGNOSIS — I10 Essential (primary) hypertension: Secondary | ICD-10-CM | POA: Diagnosis not present

## 2019-08-08 DIAGNOSIS — E782 Mixed hyperlipidemia: Secondary | ICD-10-CM | POA: Diagnosis not present

## 2019-08-08 DIAGNOSIS — G510 Bell's palsy: Secondary | ICD-10-CM | POA: Diagnosis not present

## 2019-08-08 DIAGNOSIS — E1165 Type 2 diabetes mellitus with hyperglycemia: Secondary | ICD-10-CM | POA: Diagnosis not present

## 2019-08-08 DIAGNOSIS — R42 Dizziness and giddiness: Secondary | ICD-10-CM | POA: Diagnosis not present

## 2019-08-15 DIAGNOSIS — E1165 Type 2 diabetes mellitus with hyperglycemia: Secondary | ICD-10-CM | POA: Diagnosis not present

## 2019-08-15 DIAGNOSIS — Z23 Encounter for immunization: Secondary | ICD-10-CM | POA: Diagnosis not present

## 2019-08-15 DIAGNOSIS — I63312 Cerebral infarction due to thrombosis of left middle cerebral artery: Secondary | ICD-10-CM | POA: Diagnosis not present

## 2019-08-15 DIAGNOSIS — I10 Essential (primary) hypertension: Secondary | ICD-10-CM | POA: Diagnosis not present

## 2019-08-15 DIAGNOSIS — E782 Mixed hyperlipidemia: Secondary | ICD-10-CM | POA: Diagnosis not present

## 2019-12-15 DIAGNOSIS — E041 Nontoxic single thyroid nodule: Secondary | ICD-10-CM | POA: Diagnosis not present

## 2019-12-15 DIAGNOSIS — R5383 Other fatigue: Secondary | ICD-10-CM | POA: Diagnosis not present

## 2019-12-15 DIAGNOSIS — E782 Mixed hyperlipidemia: Secondary | ICD-10-CM | POA: Diagnosis not present

## 2019-12-15 DIAGNOSIS — E1142 Type 2 diabetes mellitus with diabetic polyneuropathy: Secondary | ICD-10-CM | POA: Diagnosis not present

## 2019-12-15 DIAGNOSIS — E1165 Type 2 diabetes mellitus with hyperglycemia: Secondary | ICD-10-CM | POA: Diagnosis not present

## 2019-12-15 DIAGNOSIS — I10 Essential (primary) hypertension: Secondary | ICD-10-CM | POA: Diagnosis not present

## 2019-12-19 DIAGNOSIS — E1165 Type 2 diabetes mellitus with hyperglycemia: Secondary | ICD-10-CM | POA: Diagnosis not present

## 2019-12-19 DIAGNOSIS — I10 Essential (primary) hypertension: Secondary | ICD-10-CM | POA: Diagnosis not present

## 2019-12-19 DIAGNOSIS — E782 Mixed hyperlipidemia: Secondary | ICD-10-CM | POA: Diagnosis not present

## 2019-12-19 DIAGNOSIS — I63312 Cerebral infarction due to thrombosis of left middle cerebral artery: Secondary | ICD-10-CM | POA: Diagnosis not present

## 2020-01-09 DIAGNOSIS — I10 Essential (primary) hypertension: Secondary | ICD-10-CM | POA: Diagnosis not present

## 2020-01-09 DIAGNOSIS — E612 Magnesium deficiency: Secondary | ICD-10-CM | POA: Diagnosis not present

## 2020-01-09 DIAGNOSIS — E1142 Type 2 diabetes mellitus with diabetic polyneuropathy: Secondary | ICD-10-CM | POA: Diagnosis not present

## 2020-01-09 DIAGNOSIS — E782 Mixed hyperlipidemia: Secondary | ICD-10-CM | POA: Diagnosis not present

## 2020-02-17 DIAGNOSIS — L309 Dermatitis, unspecified: Secondary | ICD-10-CM | POA: Diagnosis not present

## 2020-02-17 DIAGNOSIS — E1142 Type 2 diabetes mellitus with diabetic polyneuropathy: Secondary | ICD-10-CM | POA: Diagnosis not present

## 2020-02-17 DIAGNOSIS — B370 Candidal stomatitis: Secondary | ICD-10-CM | POA: Diagnosis not present

## 2020-03-07 DIAGNOSIS — S76111A Strain of right quadriceps muscle, fascia and tendon, initial encounter: Secondary | ICD-10-CM | POA: Diagnosis not present

## 2020-03-07 DIAGNOSIS — Z6841 Body Mass Index (BMI) 40.0 and over, adult: Secondary | ICD-10-CM | POA: Diagnosis not present

## 2020-04-19 DIAGNOSIS — E1142 Type 2 diabetes mellitus with diabetic polyneuropathy: Secondary | ICD-10-CM | POA: Diagnosis not present

## 2020-04-19 DIAGNOSIS — E041 Nontoxic single thyroid nodule: Secondary | ICD-10-CM | POA: Diagnosis not present

## 2020-04-19 DIAGNOSIS — E1165 Type 2 diabetes mellitus with hyperglycemia: Secondary | ICD-10-CM | POA: Diagnosis not present

## 2020-04-19 DIAGNOSIS — R946 Abnormal results of thyroid function studies: Secondary | ICD-10-CM | POA: Diagnosis not present

## 2020-04-19 DIAGNOSIS — E782 Mixed hyperlipidemia: Secondary | ICD-10-CM | POA: Diagnosis not present

## 2020-04-19 DIAGNOSIS — I10 Essential (primary) hypertension: Secondary | ICD-10-CM | POA: Diagnosis not present

## 2020-04-19 DIAGNOSIS — Z0001 Encounter for general adult medical examination with abnormal findings: Secondary | ICD-10-CM | POA: Diagnosis not present

## 2020-04-23 DIAGNOSIS — Z1212 Encounter for screening for malignant neoplasm of rectum: Secondary | ICD-10-CM | POA: Diagnosis not present

## 2020-04-23 DIAGNOSIS — Z0001 Encounter for general adult medical examination with abnormal findings: Secondary | ICD-10-CM | POA: Diagnosis not present

## 2020-04-23 DIAGNOSIS — E1165 Type 2 diabetes mellitus with hyperglycemia: Secondary | ICD-10-CM | POA: Diagnosis not present

## 2020-04-23 DIAGNOSIS — E1142 Type 2 diabetes mellitus with diabetic polyneuropathy: Secondary | ICD-10-CM | POA: Diagnosis not present

## 2020-04-23 DIAGNOSIS — E782 Mixed hyperlipidemia: Secondary | ICD-10-CM | POA: Diagnosis not present

## 2020-04-23 DIAGNOSIS — I10 Essential (primary) hypertension: Secondary | ICD-10-CM | POA: Diagnosis not present

## 2020-05-17 DIAGNOSIS — M79604 Pain in right leg: Secondary | ICD-10-CM | POA: Diagnosis not present

## 2020-05-17 DIAGNOSIS — M545 Low back pain: Secondary | ICD-10-CM | POA: Diagnosis not present

## 2020-05-22 DIAGNOSIS — M79604 Pain in right leg: Secondary | ICD-10-CM | POA: Diagnosis not present

## 2020-05-22 DIAGNOSIS — M545 Low back pain: Secondary | ICD-10-CM | POA: Diagnosis not present

## 2020-05-24 DIAGNOSIS — M79604 Pain in right leg: Secondary | ICD-10-CM | POA: Diagnosis not present

## 2020-05-24 DIAGNOSIS — M545 Low back pain: Secondary | ICD-10-CM | POA: Diagnosis not present

## 2020-05-28 DIAGNOSIS — M545 Low back pain: Secondary | ICD-10-CM | POA: Diagnosis not present

## 2020-05-28 DIAGNOSIS — M79604 Pain in right leg: Secondary | ICD-10-CM | POA: Diagnosis not present

## 2020-05-30 DIAGNOSIS — M79604 Pain in right leg: Secondary | ICD-10-CM | POA: Diagnosis not present

## 2020-05-30 DIAGNOSIS — M545 Low back pain: Secondary | ICD-10-CM | POA: Diagnosis not present

## 2020-06-04 DIAGNOSIS — M545 Low back pain: Secondary | ICD-10-CM | POA: Diagnosis not present

## 2020-06-04 DIAGNOSIS — M79604 Pain in right leg: Secondary | ICD-10-CM | POA: Diagnosis not present

## 2020-06-13 DIAGNOSIS — M79604 Pain in right leg: Secondary | ICD-10-CM | POA: Diagnosis not present

## 2020-06-13 DIAGNOSIS — M545 Low back pain: Secondary | ICD-10-CM | POA: Diagnosis not present

## 2020-06-20 DIAGNOSIS — M545 Low back pain: Secondary | ICD-10-CM | POA: Diagnosis not present

## 2020-06-20 DIAGNOSIS — Z7189 Other specified counseling: Secondary | ICD-10-CM | POA: Diagnosis not present

## 2020-06-22 ENCOUNTER — Ambulatory Visit (INDEPENDENT_AMBULATORY_CARE_PROVIDER_SITE_OTHER): Payer: BC Managed Care – PPO

## 2020-06-22 ENCOUNTER — Ambulatory Visit
Admission: EM | Admit: 2020-06-22 | Discharge: 2020-06-22 | Disposition: A | Discharge status: Home / Self Care | Payer: BC Managed Care – PPO | Attending: Emergency Medicine | Admitting: Emergency Medicine

## 2020-06-22 DIAGNOSIS — M25462 Effusion, left knee: Secondary | ICD-10-CM | POA: Diagnosis not present

## 2020-06-22 DIAGNOSIS — M25562 Pain in left knee: Secondary | ICD-10-CM | POA: Diagnosis not present

## 2020-06-22 DIAGNOSIS — S8992XA Unspecified injury of left lower leg, initial encounter: Secondary | ICD-10-CM

## 2020-06-22 MED ORDER — PREDNISONE 20 MG PO TABS: 20.0000 mg | ORAL_TABLET | Freq: Two times a day (BID) | ORAL | 0 refills | Status: AC

## 2020-06-22 NOTE — Discharge Instructions (Signed)
X-rays negative for fracture or dislocation.  Joint effusion evident per radiologist Continue conservative management of rest, ice, and elevation Prednisone prescribed.  Take as directed and to completion Follow up with PCP if symptoms persist Return or go to the ER if you have any new or worsening symptoms (fever, chills, chest pain, redness, swelling, bruising, deformity, etc...)

## 2020-06-22 NOTE — ED Triage Notes (Signed)
Pt presents with knee injury after getting out of vehicle

## 2020-06-22 NOTE — ED Provider Notes (Signed)
[facility]   [id number] 06/22/20 Arrival Time: 1254  GX:[id number] PAIN  SUBJECTIVE: History from: patient. Natalie Snyder is a 54 y.o. female complains of LT knee pain x 1 day.  Symptoms began after stepping out of jeep and hyperextended knee.  Localizes the pain to the front of knee.  Describes the pain as constant, throbbing, and achy in character.  Has NOT tried OTC medications without relief.  Symptoms are made worse with walking.  Denies similar symptoms in the past.  Complains of swelling.  Denies fever, chills, erythema, ecchymosis, weakness, numbness and tingling.  ROS: As per HPI.  All other pertinent ROS negative.     Past Medical History:  Diagnosis Date  . Anticoagulant long-term use   . Carpal tunnel syndrome on both sides    s/p  right carpal tunnel release 11-11-2018  . Depression   . Herpes   . 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 Hypertension    followed by pcp  . Hypothyroidism    followed by pcp  . Thyroid nodule    incidental finding on imaging 2017  . Type 2 diabetes mellitus (HCC)    followed by pcp   Past Surgical History:  Procedure Laterality Date  . CARPAL TUNNEL RELEASE Right 11/11/2018   Procedure: CARPAL TUNNEL RELEASE;  Surgeon: Frederico Hamman, MD;  Location: [facility];  Service: Orthopedics;  Laterality: Right;  . CARPAL TUNNEL RELEASE Left 12/23/2018   Procedure: CARPAL TUNNEL RELEASE;  Surgeon: Frederico Hamman, MD;  Location: [facility];  Service: Orthopedics;  Laterality: Left;  . CESAREAN SECTION     Allergies  Allergen Reactions  . Latex Rash   No current facility-administered medications on file prior to encounter.   Current  Outpatient Medications on File Prior to Encounter  Medication Sig Dispense Refill  . acetaminophen (TYLENOL) 500 MG tablet Take 500 mg by mouth every 6 (six) hours as needed for moderate pain.    Marland Kitchen amLODipine (NORVASC) 10 MG tablet Take 10 mg by mouth daily.     Marland Kitchen aspirin EC 81 MG EC tablet Take 1 tablet (81 mg total) by mouth daily. 30 tablet 0  . atorvastatin (LIPITOR) 40 MG tablet Take 1 tablet (40 mg total) by mouth daily at 6 PM. 30 tablet 1  . clopidogrel (PLAVIX) 75 MG tablet Take 1 tablet (75 mg total) by mouth daily. 30 tablet 2  . Dulaglutide (TRULICITY) 0.75 MG/0.5ML SOPN Inject 0.75 mg into the skin every 7 (seven) days. Wednesday's    . escitalopram (LEXAPRO) 20 MG tablet Take 20 mg by mouth daily.    Marland Kitchen gabapentin (NEURONTIN) 600 MG tablet Take 600 mg by mouth 3 (three) times daily.    Marland Kitchen HYDROcodone-acetaminophen (NORCO) 5-325 MG tablet Take 1 tablet by mouth every 4 (four) hours as needed for moderate pain. 40 tablet 0  . insulin NPH-regular Human (70-30) 100 UNIT/ML injection Inject 10-80 Units into the skin 2 (two) times daily with a meal.     . levothyroxine (SYNTHROID, LEVOTHROID) 100 MCG tablet Take 100 mcg by mouth daily before breakfast.    . lisinopril-hydrochlorothiazide (PRINZIDE,ZESTORETIC) 20-12.5 MG tablet  Take 2 tablets by mouth daily.     . meclizine (ANTIVERT) 25 MG tablet Take 1 tablet (25 mg total) by mouth 3 (three) times daily as needed for dizziness. 30 tablet 0  . Melatonin 10 MG TABS Take 20 mg by mouth at bedtime.    . metFORMIN (GLUCOPHAGE-XR) 500 MG 24 hr tablet Take 1,000 mg by mouth 2 (two) times daily with a meal.     . metoprolol succinate (TOPROL-XL) 50 MG 24 hr tablet Take 50 mg by mouth every evening.     . pantoprazole (PROTONIX) 40 MG tablet Take 1 tablet (40 mg total) by mouth daily. (Patient taking differently: Take 40 mg by mouth 2 (two) times daily. ) 30 tablet 0   Social History   Socioeconomic History  . Marital status: Married    Spouse  name: Not on file  . Number of children: 1  . Years of education: 12th grade  . Highest education level: Not on file  Occupational History  . Occupation: unemployed - CNA  Tobacco Use  . Smoking status: Former Smoker    Packs/day: 1.00    Years: 10.00    Pack years: 10.00    Types: Cigarettes    Quit date: 07/23/1995    Years since quitting: 24.9  . Smokeless tobacco: Never Used  Vaping Use  . Vaping Use: Never used  Substance and Sexual Activity  . Alcohol use: No  . Drug use: Never  . Sexual activity: Yes    Birth control/protection: Post-menopausal  Other Topics Concern  . Not on file  Social History Narrative   Lives at home with husband.   Right-handed.   No caffeine use.   Social Determinants of Health   Financial Resource Strain:   . Difficulty of Paying Living Expenses: Not on file  Food Insecurity:   . Worried About Programme researcher, broadcasting/film/video in the Last Year: Not on file  . Ran Out of Food in the Last Year: Not on file  Transportation Needs:   . Lack of Transportation (Medical): Not on file  . Lack of Transportation (Non-Medical): Not on file  Physical Activity:   . Days of Exercise per Week: Not on file  . Minutes of Exercise per Session: Not on file  Stress:   . Feeling of Stress : Not on file  Social Connections:   . Frequency of Communication with Friends and Family: Not on file  . Frequency of Social Gatherings with Friends and Family: Not on file  . Attends Religious Services: Not on file  . Active Member of Clubs or Organizations: Not on file  . Attends Banker Meetings: Not on file  . Marital Status: Not on file  Intimate Partner Violence:   . Fear of Current or Ex-Partner: Not on file  . Emotionally Abused: Not on file  . Physically Abused: Not on file  . Sexually Abused: Not on file   Family History  Problem Relation Age of Onset  . Pulmonary embolism Mother 27  . Aneurysm Mother   . CAD Father        5 MIs, first while in 46s  .  Parkinson's disease Father   . Hypertension Father   . Stroke Maternal Grandmother     OBJECTIVE:  Vitals:   06/22/20 1349  BP: 128/77  Pulse: 78  Resp: 18  Temp: 98.3 F (36.8 C)  SpO2: 94%    General appearance: ALERT; in no acute distress.  Head: NCAT  Lungs: Normal respiratory effort CV: Dorsalis pedis pulse 2+ Musculoskeletal: LT knee Inspection: Skin warm, dry, clear and intact without obvious erythema, effusion, or ecchymosis.  Palpation: Nontender to palpation; unable to reproduce symptoms with palpation ROM: FROM active and passive; no discomfort with valgus/ varus stress Strength: 5/5 knee flexion, 4+/5 knee extension Skin: warm and dry Neurologic: Ambulates with minimal difficulty; Sensation intact about the lower extremities Psychological: alert and cooperative; normal mood and affect  DIAGNOSTIC STUDIES:  DG Knee Complete 4 Views Left  Result Date: 06/22/2020 CLINICAL DATA:  Left knee pain beginning after getting out of a vehicle yesterday. EXAM: LEFT KNEE - COMPLETE 4+ VIEW COMPARISON:  None. FINDINGS: No fracture or bone lesion. Knee joint normally spaced and aligned.  No arthropathic changes. Moderate-sized joint effusion distends the suprapatellar joint capsule. Femoral/popliteal arterial vascular calcifications are noted posteriorly. IMPRESSION: 1. No fracture or arthropathic change. 2. Moderate-sized joint effusion. Electronically Signed   By: Amie Portland M.D.   On: 06/22/2020 14:13    X-rays negative for bony abnormalities including fracture, or dislocation.   I have reviewed the x-rays myself and the radiologist interpretation. I am in agreement with the radiologist interpretation.     ASSESSMENT & PLAN:  1. Acute pain of left knee   2. Injury of left knee, initial encounter     Meds ordered this encounter  Medications  . predniSONE (DELTASONE) 20 MG tablet    Sig: Take 1 tablet (20 mg total) by mouth 2 (two) times daily with a meal for 5 days.     Dispense:  10 tablet    Refill:  0    Order Specific Question:   Supervising Provider    Answer:   Eustace Moore [[id number]]   X-rays negative for fracture or dislocation.  Joint effusion evident per radiologist Continue conservative management of rest, ice, and elevation Prednisone prescribed.  Take as directed and to completion Follow up with PCP if symptoms persist Return or go to the ER if you have any new or worsening symptoms (fever, chills, chest pain, redness, swelling, bruising, deformity, etc...)    Reviewed expectations re: course of current medical issues. Questions answered. Outlined signs and symptoms indicating need for more acute intervention. Patient verbalized understanding. After Visit Summary given.    Rennis Harding, PA-C 06/22/20 1421

## 2020-06-25 DIAGNOSIS — M545 Low back pain: Secondary | ICD-10-CM | POA: Diagnosis not present

## 2020-06-25 DIAGNOSIS — M79604 Pain in right leg: Secondary | ICD-10-CM | POA: Diagnosis not present

## 2020-06-28 DIAGNOSIS — M545 Low back pain: Secondary | ICD-10-CM | POA: Diagnosis not present

## 2020-06-28 DIAGNOSIS — M79604 Pain in right leg: Secondary | ICD-10-CM | POA: Diagnosis not present

## 2020-07-02 DIAGNOSIS — M79604 Pain in right leg: Secondary | ICD-10-CM | POA: Diagnosis not present

## 2020-07-02 DIAGNOSIS — M545 Low back pain: Secondary | ICD-10-CM | POA: Diagnosis not present

## 2020-07-09 DIAGNOSIS — M545 Low back pain: Secondary | ICD-10-CM | POA: Diagnosis not present

## 2020-07-09 DIAGNOSIS — M79604 Pain in right leg: Secondary | ICD-10-CM | POA: Diagnosis not present

## 2020-07-16 DIAGNOSIS — M545 Low back pain: Secondary | ICD-10-CM | POA: Diagnosis not present

## 2020-07-16 DIAGNOSIS — M79604 Pain in right leg: Secondary | ICD-10-CM | POA: Diagnosis not present

## 2020-07-19 DIAGNOSIS — M545 Low back pain, unspecified: Secondary | ICD-10-CM | POA: Diagnosis not present

## 2020-07-19 DIAGNOSIS — M79604 Pain in right leg: Secondary | ICD-10-CM | POA: Diagnosis not present

## 2020-07-24 DIAGNOSIS — M545 Low back pain, unspecified: Secondary | ICD-10-CM | POA: Diagnosis not present

## 2020-07-24 DIAGNOSIS — M79604 Pain in right leg: Secondary | ICD-10-CM | POA: Diagnosis not present

## 2020-07-24 DIAGNOSIS — M5459 Other low back pain: Secondary | ICD-10-CM | POA: Diagnosis not present

## 2020-07-31 DIAGNOSIS — M5459 Other low back pain: Secondary | ICD-10-CM | POA: Diagnosis not present

## 2020-07-31 DIAGNOSIS — M79604 Pain in right leg: Secondary | ICD-10-CM | POA: Diagnosis not present

## 2020-07-31 DIAGNOSIS — M545 Low back pain, unspecified: Secondary | ICD-10-CM | POA: Diagnosis not present

## 2020-08-09 DIAGNOSIS — M545 Low back pain, unspecified: Secondary | ICD-10-CM | POA: Diagnosis not present

## 2020-08-09 DIAGNOSIS — M79604 Pain in right leg: Secondary | ICD-10-CM | POA: Diagnosis not present

## 2020-08-09 DIAGNOSIS — M5459 Other low back pain: Secondary | ICD-10-CM | POA: Diagnosis not present

## 2020-08-12 DIAGNOSIS — E1165 Type 2 diabetes mellitus with hyperglycemia: Secondary | ICD-10-CM | POA: Diagnosis not present

## 2020-08-12 DIAGNOSIS — I10 Essential (primary) hypertension: Secondary | ICD-10-CM | POA: Diagnosis not present

## 2020-08-12 DIAGNOSIS — E1142 Type 2 diabetes mellitus with diabetic polyneuropathy: Secondary | ICD-10-CM | POA: Diagnosis not present

## 2020-08-12 DIAGNOSIS — Z23 Encounter for immunization: Secondary | ICD-10-CM | POA: Diagnosis not present

## 2020-08-12 DIAGNOSIS — E782 Mixed hyperlipidemia: Secondary | ICD-10-CM | POA: Diagnosis not present

## 2020-08-12 DIAGNOSIS — I63312 Cerebral infarction due to thrombosis of left middle cerebral artery: Secondary | ICD-10-CM | POA: Diagnosis not present

## 2020-08-12 DIAGNOSIS — Z1389 Encounter for screening for other disorder: Secondary | ICD-10-CM | POA: Diagnosis not present

## 2020-08-12 DIAGNOSIS — Z1331 Encounter for screening for depression: Secondary | ICD-10-CM | POA: Diagnosis not present

## 2024-03-24 ENCOUNTER — Other Ambulatory Visit: Payer: Self-pay

## 2024-03-24 ENCOUNTER — Emergency Department (HOSPITAL_BASED_OUTPATIENT_CLINIC_OR_DEPARTMENT_OTHER)
Admission: EM | Admit: 2024-03-24 | Discharge: 2024-03-25 | Disposition: A | Discharge status: Home / Self Care | Payer: PRIVATE HEALTH INSURANCE | Attending: Emergency Medicine | Admitting: Emergency Medicine

## 2024-03-24 DIAGNOSIS — Z79899 Other long term (current) drug therapy: Secondary | ICD-10-CM | POA: Insufficient documentation

## 2024-03-24 DIAGNOSIS — E1142 Type 2 diabetes mellitus with diabetic polyneuropathy: Secondary | ICD-10-CM | POA: Diagnosis not present

## 2024-03-24 DIAGNOSIS — Z91040 Latex allergy status: Secondary | ICD-10-CM | POA: Insufficient documentation

## 2024-03-24 DIAGNOSIS — Z794 Long term (current) use of insulin: Secondary | ICD-10-CM | POA: Diagnosis not present

## 2024-03-24 DIAGNOSIS — L819 Disorder of pigmentation, unspecified: Secondary | ICD-10-CM | POA: Diagnosis present

## 2024-03-24 DIAGNOSIS — I10 Essential (primary) hypertension: Secondary | ICD-10-CM | POA: Diagnosis not present

## 2024-03-24 DIAGNOSIS — Z7982 Long term (current) use of aspirin: Secondary | ICD-10-CM | POA: Insufficient documentation

## 2024-03-24 DIAGNOSIS — Z7984 Long term (current) use of oral hypoglycemic drugs: Secondary | ICD-10-CM | POA: Diagnosis not present

## 2024-03-24 DIAGNOSIS — E039 Hypothyroidism, unspecified: Secondary | ICD-10-CM | POA: Diagnosis not present

## 2024-03-24 DIAGNOSIS — Z8669 Personal history of other diseases of the nervous system and sense organs: Secondary | ICD-10-CM

## 2024-03-24 NOTE — ED Triage Notes (Signed)
 Pt POV reporting increased numbness and discoloration in toes due to neuropathy. Cap refill slightly delayed, able to move toes.

## 2024-03-25 LAB — CBC WITH DIFFERENTIAL/PLATELET
Abs Immature Granulocytes: 0.01 10*3/uL (ref 0.00–0.07)
Basophils Absolute: 0 10*3/uL (ref 0.0–0.1)
Basophils Relative: 0 %
Eosinophils Absolute: 0.2 10*3/uL (ref 0.0–0.5)
Eosinophils Relative: 3 %
HCT: 36 % (ref 36.0–46.0)
Hemoglobin: 12 g/dL (ref 12.0–15.0)
Immature Granulocytes: 0 %
Lymphocytes Relative: 23 %
Lymphs Abs: 1.5 10*3/uL (ref 0.7–4.0)
MCH: 30.4 pg (ref 26.0–34.0)
MCHC: 33.3 g/dL (ref 30.0–36.0)
MCV: 91.1 fL (ref 80.0–100.0)
Monocytes Absolute: 0.8 10*3/uL (ref 0.1–1.0)
Monocytes Relative: 12 %
Neutro Abs: 4.2 10*3/uL (ref 1.7–7.7)
Neutrophils Relative %: 62 %
Platelets: 211 10*3/uL (ref 150–400)
RBC: 3.95 MIL/uL (ref 3.87–5.11)
RDW: 13.9 % (ref 11.5–15.5)
WBC: 6.7 10*3/uL (ref 4.0–10.5)
nRBC: 0 % (ref 0.0–0.2)

## 2024-03-25 LAB — BASIC METABOLIC PANEL WITH GFR
Anion gap: 13 (ref 5–15)
BUN: 18 mg/dL (ref 6–20)
CO2: 26 mmol/L (ref 22–32)
Calcium: 9.4 mg/dL (ref 8.9–10.3)
Chloride: 102 mmol/L (ref 98–111)
Creatinine, Ser: 1.08 mg/dL — ABNORMAL HIGH (ref 0.44–1.00)
GFR, Estimated: 60 mL/min — ABNORMAL LOW (ref >60)
Glucose, Bld: 115 mg/dL — ABNORMAL HIGH (ref 70–99)
Potassium: 3.5 mmol/L (ref 3.5–5.1)
Sodium: 141 mmol/L (ref 135–145)

## 2024-03-25 NOTE — ED Provider Notes (Signed)
 [place] EMERGENCY DEPARTMENT AT [facility] Provider Note   CSN: [id number] Arrival date & time: 03/24/24  2341     History  Chief Complaint  Patient presents with   Peripheral Neuropathy    Natalie Snyder is a 58 y.o. female.  Patient is a 58 year old female with history of type 2 diabetes, prior CVA, hypertension, hypothyroidism, peripheral neuropathy.  Patient presenting today with complaints of discomfort in both feet and noticing a purpleish discoloration to all of her toes.  This has been worsening over the past several weeks.  She denies any chest pain or difficulty breathing.  She states her blood sugars have been well-controlled at home.       Home Medications Prior to Admission medications   Medication Sig Start Date End Date Taking? Authorizing Provider  acetaminophen  (TYLENOL ) 500 MG tablet Take 500 mg by mouth every 6 (six) hours as needed for moderate pain.    [provider]  amLODipine (NORVASC) 10 MG tablet Take 10 mg by mouth daily.  09/09/16   [provider]  aspirin  EC 81 MG EC tablet Take 1 tablet (81 mg total) by mouth daily. 09/24/16   Clementine Cutting, MD  atorvastatin  (LIPITOR) 40 MG tablet Take 1 tablet (40 mg total) by mouth daily at 6 PM. 05/23/16   Tyra Galley, MD  clopidogrel  (PLAVIX ) 75 MG tablet Take 1 tablet (75 mg total) by mouth daily. 05/23/16   Tyra Galley, MD  Dulaglutide (TRULICITY) 0.75 MG/0.5ML SOPN Inject 0.75 mg into the skin every 7 (seven) days. Wednesday's    [provider]  escitalopram  (LEXAPRO ) 20 MG tablet Take 20 mg by mouth daily.    [provider]  gabapentin (NEURONTIN) 600 MG tablet Take 600 mg by mouth 3 (three) times daily.    [provider]  HYDROcodone -acetaminophen  (NORCO) 5-325 MG tablet Take 1 tablet by mouth every 4 (four) hours as needed for moderate pain. 12/23/18   Chadwell, Melodie Spry, PA-C  insulin  NPH-regular Human (70-30) 100 UNIT/ML injection Inject 10-80 Units  into the skin 2 (two) times daily with a meal.     [provider]  levothyroxine (SYNTHROID, LEVOTHROID) 100 MCG tablet Take 100 mcg by mouth daily before breakfast.    [provider]  lisinopril -hydrochlorothiazide (PRINZIDE,ZESTORETIC) 20-12.5 MG tablet Take 2 tablets by mouth daily.  11/16/16   [provider]  meclizine  (ANTIVERT ) 25 MG tablet Take 1 tablet (25 mg total) by mouth 3 (three) times daily as needed for dizziness. 05/29/19   Henderly, Britni A, PA-C  Melatonin 10 MG TABS Take 20 mg by mouth at bedtime.    [provider]  metFORMIN (GLUCOPHAGE-XR) 500 MG 24 hr tablet Take 1,000 mg by mouth 2 (two) times daily with a meal.  07/27/16   [provider]  metoprolol succinate (TOPROL-XL) 50 MG 24 hr tablet Take 50 mg by mouth every evening.  08/24/16   [provider]  pantoprazole  (PROTONIX ) 40 MG tablet Take 1 tablet (40 mg total) by mouth daily. Patient taking differently: Take 40 mg by mouth 2 (two) times daily.  09/24/16   Clementine Cutting, MD      Allergies    Latex    Review of Systems   Review of Systems  All other systems reviewed and are negative.   Physical Exam Updated Vital Signs BP 125/75   Pulse 86   Temp 98 F (36.7 C) (Oral)   Resp 18   Ht 5\' 4"  (  1.626 m)   Wt 97.5 kg   SpO2 100%   BMI 36.90 kg/m  Physical Exam Vitals and nursing note reviewed.  Constitutional:      General: She is not in acute distress.    Appearance: She is well-developed. She is not diaphoretic.  HENT:     Head: Normocephalic and atraumatic.  Cardiovascular:     Rate and Rhythm: Normal rate and regular rhythm.     Heart sounds: No murmur heard.    No friction rub. No gallop.  Pulmonary:     Effort: Pulmonary effort is normal. No respiratory distress.     Breath sounds: Normal breath sounds. No wheezing.  Abdominal:     General: Bowel sounds are normal. There is no distension.     Palpations: Abdomen is soft.      Tenderness: There is no abdominal tenderness.  Musculoskeletal:        General: Normal range of motion.     Cervical back: Normal range of motion and neck supple.     Comments: DP pulses in both feet are easily palpable.  There is a subtle purpleish discoloration to all of her toes.  She has good range of motion of all of the toes.  Capillary refill slightly delayed.  She has good range of motion of all toes and sensation is intact.  Skin:    General: Skin is warm and dry.  Neurological:     General: No focal deficit present.     Mental Status: She is alert and oriented to person, place, and time.     ED Results / Procedures / Treatments   Labs (all labs ordered are listed, but only abnormal results are displayed) Labs Reviewed - No data to display  EKG None  Radiology No results found.  Procedures Procedures    Medications Ordered in ED Medications - No data to display  ED Course/ Medical Decision Making/ A&P  Patient is a 58 year old female presenting with discoloration of the toes of both feet.  Patient arrives here with stable vital signs and is afebrile.  She has easily palpable DP pulses and capillary refill is present in all toes.  None are cool to the touch or dusky appearing.  Laboratory studies unremarkable including CBC and basic metabolic panel.  I will make arrangements for patient to have outpatient studies of the circulation in her lower extremities.  Order placed with the vascular lab.  Patient to return as needed in the meantime if symptoms worsen or change.  Final Clinical Impression(s) / ED Diagnoses Final diagnoses:  None    Rx / DC Orders ED Discharge Orders     None         Orvilla Blander, MD 03/25/24 [phone]

## 2024-03-25 NOTE — Discharge Instructions (Signed)
 The vascular lab should call you to make arrangements for vascular studies of your lower extremities.
